# Patient Record
Sex: Female | Born: 1955
Health system: Southern US, Community
[De-identification: ages and names within clinical notes are randomized; demographics above are authoritative.]

## PROBLEM LIST (undated history)

## (undated) DIAGNOSIS — I878 Other specified disorders of veins: Secondary | ICD-10-CM

## (undated) DIAGNOSIS — G43909 Migraine, unspecified, not intractable, without status migrainosus: Secondary | ICD-10-CM

## (undated) DIAGNOSIS — H409 Unspecified glaucoma: Secondary | ICD-10-CM

## (undated) DIAGNOSIS — R928 Other abnormal and inconclusive findings on diagnostic imaging of breast: Secondary | ICD-10-CM

## (undated) DIAGNOSIS — E079 Disorder of thyroid, unspecified: Secondary | ICD-10-CM

## (undated) DIAGNOSIS — R32 Unspecified urinary incontinence: Secondary | ICD-10-CM

## (undated) DIAGNOSIS — Z8669 Personal history of other diseases of the nervous system and sense organs: Secondary | ICD-10-CM

## (undated) DIAGNOSIS — I87303 Chronic venous hypertension (idiopathic) without complications of bilateral lower extremity: Secondary | ICD-10-CM

## (undated) DIAGNOSIS — Z9889 Other specified postprocedural states: Secondary | ICD-10-CM

## (undated) DIAGNOSIS — I341 Nonrheumatic mitral (valve) prolapse: Secondary | ICD-10-CM

## (undated) DIAGNOSIS — J310 Chronic rhinitis: Secondary | ICD-10-CM

## (undated) DIAGNOSIS — Z8639 Personal history of other endocrine, nutritional and metabolic disease: Secondary | ICD-10-CM

## (undated) DIAGNOSIS — R112 Nausea with vomiting, unspecified: Secondary | ICD-10-CM

## (undated) DIAGNOSIS — Z8249 Family history of ischemic heart disease and other diseases of the circulatory system: Secondary | ICD-10-CM

## (undated) DIAGNOSIS — L299 Pruritus, unspecified: Secondary | ICD-10-CM

## (undated) HISTORY — PX: INCONTINENCE SURGERY: SHX676

## (undated) HISTORY — DX: Personal history of other diseases of the nervous system and sense organs: Z86.69

## (undated) HISTORY — DX: Chronic venous hypertension (idiopathic) without complications of bilateral lower extremity: I87.303

## (undated) HISTORY — DX: Other specified disorders of veins: I87.8

## (undated) HISTORY — DX: Unspecified urinary incontinence: R32

## (undated) HISTORY — DX: Pruritus, unspecified: L29.9

## (undated) HISTORY — DX: Disorder of thyroid, unspecified: E07.9

## (undated) HISTORY — DX: Personal history of other endocrine, nutritional and metabolic disease: Z86.39

## (undated) HISTORY — DX: Chronic rhinitis: J31.0

## (undated) HISTORY — DX: Unspecified glaucoma: H40.9

## (undated) HISTORY — DX: Migraine, unspecified, not intractable, without status migrainosus: G43.909

## (undated) HISTORY — DX: Nonrheumatic mitral (valve) prolapse: I34.1

---

## 1986-06-13 HISTORY — PX: OOPHORECTOMY: SHX86

## 2002-06-13 HISTORY — PX: EYE SURGERY: SHX253

## 2002-06-13 HISTORY — PX: CHOLECYSTECTOMY: SHX55

## 2002-06-13 HISTORY — PX: ABDOMINAL HYSTERECTOMY: SHX81

## 2002-10-12 HISTORY — PX: PARTIAL COLECTOMY: SHX5273

## 2002-10-12 HISTORY — PX: COLONOSCOPY: SHX174

## 2002-10-29 LAB — HM COLONOSCOPY

## 2015-01-12 DIAGNOSIS — Z8249 Family history of ischemic heart disease and other diseases of the circulatory system: Secondary | ICD-10-CM

## 2015-01-12 HISTORY — DX: Family history of ischemic heart disease and other diseases of the circulatory system: Z82.49

## 2015-01-22 ENCOUNTER — Ambulatory Visit (INDEPENDENT_AMBULATORY_CARE_PROVIDER_SITE_OTHER): Payer: Federal, State, Local not specified - PPO | Admitting: Family Medicine

## 2015-01-22 ENCOUNTER — Encounter: Payer: Self-pay | Admitting: Family Medicine

## 2015-01-22 VITALS — BP 112/76 | HR 71 | Temp 98.7°F | Resp 17 | Ht 66.0 in | Wt 177.0 lb

## 2015-01-22 DIAGNOSIS — G43909 Migraine, unspecified, not intractable, without status migrainosus: Secondary | ICD-10-CM

## 2015-01-22 DIAGNOSIS — G43109 Migraine with aura, not intractable, without status migrainosus: Secondary | ICD-10-CM

## 2015-01-22 DIAGNOSIS — Z8249 Family history of ischemic heart disease and other diseases of the circulatory system: Secondary | ICD-10-CM | POA: Diagnosis not present

## 2015-01-22 MED ORDER — SUMATRIPTAN SUCCINATE 100 MG PO TABS: 100.0000 mg | ORAL_TABLET | Freq: Every day | ORAL | Status: DC | PRN

## 2015-01-22 NOTE — Progress Notes (Signed)
Office Note 01/25/2015  CC:  Chief Complaint  Patient presents with  . Establish Care  . Migraine    needs medication refilled    HPI:  Melinda English is a 59 y.o. White female who is here to establish care. Patient's most recent primary MD: Dr. Ulysees Barns. Old records were not reviewed prior to or during today's visit.  Needs imitrex RF'd. Says 9 pills usually lasts her 2 mo.  No consistent pattern to her HA's. Takes amitriptyline and vitamin B2 as prophylaxis for these.  FH AAA, (dad and paternal uncle and 2 paternal aunts).  She has never been screened.  Past Medical History  Diagnosis Date  . History of narrow angle glaucoma     angle narrowing  . Migraines   . Urine incontinence   . Mitral valve prolapse     with regurg per pt    Past Surgical History  Procedure Laterality Date  . Cholecystectomy  2004  . Abdominal hysterectomy  2004    endometriosis  . Eye surgery Bilateral 2004    for narrow angle glaucoma--laser   . Partial colectomy  10/2002    endometriosis was on colon as well--approx 6 inches.  . Oophorectomy Right 1988    dermoid cyst  . Colonoscopy  04/2003    Family History  Problem Relation Age of Onset  . Arthritis Mother   . Heart disease Father   . Atrial fibrillation Father   . Diabetes Father   . AAA (abdominal aortic aneurysm) Father   . Breast cancer Sister   . Colon cancer Maternal Uncle   . Colon cancer Maternal Uncle     Social History   Social History  . Marital Status: Married    Spouse Name: N/A  . Number of Children: N/A  . Years of Education: N/A   Occupational History  . Not on file.   Social History Main Topics  . Smoking status: Never Smoker   . Smokeless tobacco: Never Used  . Alcohol Use: No  . Drug Use: No  . Sexual Activity: Not on file   Other Topics Concern  . Not on file   Social History Narrative   Married, 2 daughters.   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   No T/A/Ds.    Outpatient Encounter Prescriptions as of 01/22/2015  Medication Sig  . amitriptyline (ELAVIL) 10 MG tablet Take 10 mg by mouth at bedtime as needed.  . Ascorbic Acid (VITAMIN C) 1000 MG tablet Take 1,000 mg by mouth daily.  Marland Kitchen aspirin EC 81 MG tablet Take 81 mg by mouth daily.  . ASTRAGALUS PO Take by mouth as needed.  . cetirizine (ZYRTEC) 10 MG tablet Take 10 mg by mouth daily.  . cyanocobalamin 100 MCG tablet Take 100 mcg by mouth daily.  Marland Kitchen Specialty Vitamins Products (ONE-A-DAY BONE STRENGTH PO) Take by mouth daily.  . SUMAtriptan (IMITREX) 100 MG tablet Take 1 tablet (100 mg total) by mouth daily as needed for migraine. May repeat in 2 hours if headache persists or recurs.  . [DISCONTINUED] SUMAtriptan (IMITREX) 100 MG tablet Take 100 mg by mouth daily as needed for migraine. May repeat in 2 hours if headache persists or recurs.   No facility-administered encounter medications on file as of 01/22/2015.    Not on File  ROS Review of Systems  Constitutional: Negative for fever and fatigue.  HENT: Negative for congestion and sore throat.   Eyes:  Negative for visual disturbance.  Respiratory: Negative for cough.   Cardiovascular: Negative for chest pain.  Gastrointestinal: Negative for nausea and abdominal pain.  Genitourinary: Negative for dysuria.  Musculoskeletal: Negative for back pain and joint swelling.  Skin: Negative for rash.  Neurological: Negative for weakness and headaches.  Hematological: Negative for adenopathy.    PE; Blood pressure 112/76, pulse 71, temperature 98.7 F (37.1 C), temperature source Oral, resp. rate 17, height  (1.676 m), weight 177 lb (80.287 kg), SpO2 99 %. Gen: Alert, well appearing.  Patient is oriented to person, place, time, and situation. VWU:JWJX: no injection, icteris, swelling, or exudate.  EOMI, PERRLA. Mouth: lips without lesion/swelling.  Oral mucosa pink and moist. Oropharynx without erythema, exudate, or  swelling.  CV: RRR, no m/r/g.   LUNGS: CTA bilat, nonlabored resps, good aeration in all lung fields. EXT: no clubbing, cyanosis, or edema.   Pertinent labs:  none  ASSESSMENT AND PLAN:   New pt; obtain old records.  1) Migraine HA syndrome: historically has responded well to prn imitrex, RF'd today. Takes occasional prophylactic amitriptyline and vitamin B2 when he HA's area starting to cluster together.  2) Fam hx of AAA: will obtain screening aortic ultrasound at [facility] radiology.  An After Visit Summary was printed and given to the patient.  Return for CPE (fasting) in 2-3 months.

## 2015-01-22 NOTE — Progress Notes (Signed)
Pre visit review using our clinic review tool, if applicable. No additional management support is needed unless otherwise documented below in the visit note. 

## 2015-02-04 ENCOUNTER — Ambulatory Visit (HOSPITAL_COMMUNITY)
Admission: RE | Admit: 2015-02-04 | Discharge: 2015-02-04 | Disposition: A | Discharge status: Home / Self Care | Payer: Federal, State, Local not specified - PPO | Source: Ambulatory Visit | Attending: Family Medicine | Admitting: Family Medicine

## 2015-02-04 ENCOUNTER — Encounter (HOSPITAL_COMMUNITY): Payer: Self-pay

## 2015-02-04 DIAGNOSIS — Z8249 Family history of ischemic heart disease and other diseases of the circulatory system: Secondary | ICD-10-CM | POA: Diagnosis present

## 2015-02-04 HISTORY — DX: Family history of ischemic heart disease and other diseases of the circulatory system: Z82.49

## 2015-03-16 ENCOUNTER — Encounter: Payer: Self-pay | Admitting: Family Medicine

## 2015-04-07 ENCOUNTER — Encounter: Payer: Federal, State, Local not specified - PPO | Admitting: Family Medicine

## 2015-05-25 ENCOUNTER — Encounter: Payer: Self-pay | Admitting: Family Medicine

## 2015-05-25 ENCOUNTER — Ambulatory Visit (INDEPENDENT_AMBULATORY_CARE_PROVIDER_SITE_OTHER): Payer: Federal, State, Local not specified - PPO | Admitting: Family Medicine

## 2015-05-25 VITALS — BP 133/83 | Temp 98.7°F | Ht 67.75 in | Wt 179.0 lb

## 2015-05-25 DIAGNOSIS — J069 Acute upper respiratory infection, unspecified: Secondary | ICD-10-CM | POA: Diagnosis not present

## 2015-05-25 DIAGNOSIS — B9789 Other viral agents as the cause of diseases classified elsewhere: Principal | ICD-10-CM

## 2015-05-25 NOTE — Progress Notes (Signed)
OFFICE NOTE  05/25/2015  CC: No chief complaint on file.  HPI: Patient is a 59 y.o. Caucasian female who is here for cough. Onset 3-4 days ago with head/nasal/sinus congestion, cough began today.  Ears hurting lately, esp R. No signif ST, has had a couple of migraines with this.  No fever.  No body aches.  No rash.  Appetite/PO intake are fine.  No SOB, question of wheeze this morning.  No chest tightness.   Pertinent PMH:  Past medical, surgical, social, and family history reviewed and no changes are noted since last office visit. No hx of asthma/CLD and no hx of smoking.  MEDS:  Outpatient Prescriptions Prior to Visit  Medication Sig Dispense Refill  . amitriptyline (ELAVIL) 10 MG tablet Take 10 mg by mouth at bedtime as needed.    . Ascorbic Acid (VITAMIN C) 1000 MG tablet Take 1,000 mg by mouth daily.    Marland Kitchen. aspirin EC 81 MG tablet Take 81 mg by mouth daily.    . ASTRAGALUS PO Take by mouth as needed.    . cetirizine (ZYRTEC) 10 MG tablet Take 10 mg by mouth daily.    . cyanocobalamin 100 MCG tablet Take 100 mcg by mouth daily.    Marland Kitchen. Specialty Vitamins Products (ONE-A-DAY BONE STRENGTH PO) Take by mouth daily.    . SUMAtriptan (IMITREX) 100 MG tablet Take 1 tablet (100 mg total) by mouth daily as needed for migraine. May repeat in 2 hours if headache persists or recurs. 9 tablet 6   No facility-administered medications prior to visit.    PE: Blood pressure 133/83, temperature 98.7 F (37.1 C), temperature source Oral, height 5' 7.75" (1.721 m), weight 179 lb (81.194 kg). VS: noted--normal. Gen: alert, NAD, NONTOXIC APPEARING. HEENT: eyes without injection, drainage, or swelling.  Ears: EACs clear, TMs with normal light reflex and landmarks.  Nose: Clear rhinorrhea, with some dried, crusty exudate adherent to mildly injected mucosa.  No purulent d/c.  No paranasal sinus TTP.  No facial swelling.  Throat and mouth without focal lesion.  No pharyngial swelling, erythema, or exudate.    Neck: supple, no LAD.   LUNGS: CTA bilat, nonlabored resps.   CV: RRR, no m/r/g. EXT: no c/c/e SKIN: no rash   IMPRESSION AND PLAN:  URI with cough. No sign of bacterial infection or RAD. Continue zyrtec qhs. Get otc generic robitussin DM OR Mucinex DM and use as directed on the packaging for cough and congestion. Use otc generic saline nasal spray 2-3 times per day to irrigate/moisturize your nasal passages.  An After Visit Summary was printed and given to the patient.  She declined flu vaccine again today.  FOLLOW UP: prn

## 2015-05-25 NOTE — Patient Instructions (Signed)
Get otc generic robitussin DM OR Mucinex DM and use as directed on the packaging for cough and congestion. Use otc generic saline nasal spray 2-3 times per day to irrigate/moisturize your nasal passages.  Continue zyrtec nightly.

## 2015-05-28 ENCOUNTER — Telehealth: Payer: Self-pay | Admitting: Family Medicine

## 2015-05-28 NOTE — Telephone Encounter (Signed)
Patient feels like she has bronchitis. She is requesting Rx to be [facility]. Advised patient Dr. Milinda CaveMcGowen is gone for the day & that he will see the message tomorrow.

## 2015-05-29 ENCOUNTER — Ambulatory Visit (INDEPENDENT_AMBULATORY_CARE_PROVIDER_SITE_OTHER): Payer: Federal, State, Local not specified - PPO | Admitting: Family Medicine

## 2015-05-29 ENCOUNTER — Encounter: Payer: Self-pay | Admitting: Family Medicine

## 2015-05-29 ENCOUNTER — Encounter: Payer: Self-pay | Admitting: *Deleted

## 2015-05-29 VITALS — BP 118/82 | HR 68 | Temp 97.9°F | Resp 16 | Ht 67.75 in | Wt 179.5 lb

## 2015-05-29 DIAGNOSIS — J209 Acute bronchitis, unspecified: Secondary | ICD-10-CM | POA: Diagnosis not present

## 2015-05-29 NOTE — Progress Notes (Signed)
Pre visit review using our clinic review tool, if applicable. No additional management support is needed unless otherwise documented below in the visit note. 

## 2015-05-29 NOTE — Patient Instructions (Signed)
Try 2 puffs on your husband's inhaler every 6 hours as needed for excessive dry cough or chest tightness.

## 2015-05-29 NOTE — Telephone Encounter (Signed)
Pt advised and voiced understanding.  Pt has apt today at 1:00pm to rechecked and to get work note.

## 2015-05-29 NOTE — Progress Notes (Signed)
OFFICE VISIT  05/29/2015   CC:  Chief Complaint  Patient presents with  . Cough    also needs work note     HPI:    Patient is a 59 y.o. Caucasian female who presents for respiratory complaints.   I saw her 12/12 in office for URI sx's. Sx's clearing in upper airway lately, but cough was significant all week, just started getting better today, tightness felt in chest lately.  No purulent sputum.  No SOB or wheezing.  Mucinex DM, saline, zyrtec all used.  No fevers.  Missed work all week.  Past Medical History  Diagnosis Date  . History of narrow angle glaucoma     angle narrowing  . Migraines   . Urine incontinence   . Mitral valve prolapse     with trace regurg per old records  . Family history of abdominal aortic aneurysm 01/2015    Screening aortic u/s NEG  . History of hypothyroidism     was on armour thyroid 60mg  qd at one point--then stopped taking med old records don't have any f/u thyroid labs after this    Past Surgical History  Procedure Laterality Date  . Cholecystectomy  2004  . Abdominal hysterectomy  2004    with unilateral salpingooopherectomy (endometriosis)  . Eye surgery Bilateral 2004    for narrow angle glaucoma--laser   . Partial colectomy  10/2002    endometriosis was on colon as well--approx 6 inches.  . Oophorectomy Right 1988    dermoid cyst  . Colonoscopy  10/2002    Outpatient Prescriptions Prior to Visit  Medication Sig Dispense Refill  . amitriptyline (ELAVIL) 10 MG tablet Take 10 mg by mouth at bedtime as needed.    . Ascorbic Acid (VITAMIN C) 1000 MG tablet Take 1,000 mg by mouth daily.    Marland Kitchen. aspirin EC 81 MG tablet Take 81 mg by mouth daily.    . ASTRAGALUS PO Take by mouth as needed.    . cetirizine (ZYRTEC) 10 MG tablet Take 10 mg by mouth daily.    . cyanocobalamin 100 MCG tablet Take 100 mcg by mouth daily.    Marland Kitchen. Specialty Vitamins Products (ONE-A-DAY BONE STRENGTH PO) Take by mouth daily.    . SUMAtriptan (IMITREX) 100 MG tablet  Take 1 tablet (100 mg total) by mouth daily as needed for migraine. May repeat in 2 hours if headache persists or recurs. 9 tablet 6   No facility-administered medications prior to visit.    No Known Allergies  ROS As per HPI  PE: Blood pressure 118/82, pulse 68, temperature 97.9 F (36.6 C), temperature source Oral, resp. rate 16, height 5' 7.75" (1.721 m), weight 179 lb 8 oz (81.421 kg), SpO2 99 %. VS: noted--normal. Gen: alert, NAD, NONTOXIC APPEARING. HEENT: eyes without injection, drainage, or swelling.  Ears: EACs clear, TMs with normal light reflex and landmarks.  Nose: scant amount of clear rhinorrhea, with some dried, crusty exudate adherent to mildly injected mucosa.  No purulent d/c.  No paranasal sinus TTP.  No facial swelling.  Throat and mouth without focal lesion.  No pharyngial swelling, erythema, or exudate.   Neck: supple, no LAD.   LUNGS: CTA bilat, nonlabored resps.  Some post-exhalation coughing is noted after most deep breaths.  No wheezing and no prolongation of exp phase. CV: RRR, no m/r/g. EXT: no c/c/e SKIN: no rash  LABS:  none  IMPRESSION AND PLAN:  URI resolving. Acute bronchitis: recommended she continue current meds, try  2 puffs of albuterol q6h prn excessive dry cough or chest tightness.  Work excuse written for all of this week 12/12-12/16.  May return to work 06/01/15. An After Visit Summary was printed and given to the patient.  FOLLOW UP: No Follow-up on file.

## 2015-05-29 NOTE — Telephone Encounter (Signed)
Please advise. Thanks.  

## 2015-05-29 NOTE — Telephone Encounter (Signed)
No rx cough med recommended. She needs to try robitussin DM or mucinex DM over the counter.-thx

## 2015-12-28 ENCOUNTER — Ambulatory Visit (INDEPENDENT_AMBULATORY_CARE_PROVIDER_SITE_OTHER): Payer: Federal, State, Local not specified - PPO | Admitting: Family Medicine

## 2015-12-28 ENCOUNTER — Encounter: Payer: Self-pay | Admitting: Family Medicine

## 2015-12-28 VITALS — BP 124/79 | HR 68 | Temp 98.1°F | Resp 16 | Ht 66.25 in | Wt 180.0 lb

## 2015-12-28 DIAGNOSIS — Z124 Encounter for screening for malignant neoplasm of cervix: Secondary | ICD-10-CM

## 2015-12-28 DIAGNOSIS — Z1211 Encounter for screening for malignant neoplasm of colon: Secondary | ICD-10-CM

## 2015-12-28 DIAGNOSIS — Z0000 Encounter for general adult medical examination without abnormal findings: Secondary | ICD-10-CM

## 2015-12-28 DIAGNOSIS — Z8669 Personal history of other diseases of the nervous system and sense organs: Secondary | ICD-10-CM

## 2015-12-28 DIAGNOSIS — R7989 Other specified abnormal findings of blood chemistry: Secondary | ICD-10-CM | POA: Diagnosis not present

## 2015-12-28 DIAGNOSIS — Z1239 Encounter for other screening for malignant neoplasm of breast: Secondary | ICD-10-CM | POA: Diagnosis not present

## 2015-12-28 DIAGNOSIS — Z1283 Encounter for screening for malignant neoplasm of skin: Secondary | ICD-10-CM

## 2015-12-28 LAB — COMPREHENSIVE METABOLIC PANEL
ALBUMIN: 4.6 g/dL (ref 3.5–5.2)
ALK PHOS: 67 U/L (ref 39–117)
ALT: 27 U/L (ref 0–35)
AST: 26 U/L (ref 0–37)
BUN: 14 mg/dL (ref 6–23)
CO2: 29 mEq/L (ref 19–32)
Calcium: 9.7 mg/dL (ref 8.4–10.5)
Chloride: 102 mEq/L (ref 96–112)
Creatinine, Ser: 0.82 mg/dL (ref 0.40–1.20)
GFR: 75.49 mL/min (ref >60.00)
Glucose, Bld: 78 mg/dL (ref 70–99)
POTASSIUM: 4.2 meq/L (ref 3.5–5.1)
Sodium: 139 mEq/L (ref 135–145)
TOTAL PROTEIN: 7.6 g/dL (ref 6.0–8.3)
Total Bilirubin: 1 mg/dL (ref 0.2–1.2)

## 2015-12-28 LAB — LIPID PANEL
Cholesterol: 244 mg/dL — ABNORMAL HIGH (ref 0–200)
HDL: 42.2 mg/dL (ref >39.00)
NONHDL: 201.33
TRIGLYCERIDES: 367 mg/dL — AB (ref 0.0–149.0)
Total CHOL/HDL Ratio: 6
VLDL: 73.4 mg/dL — ABNORMAL HIGH (ref 0.0–40.0)

## 2015-12-28 LAB — TSH: TSH: 3.59 u[IU]/mL (ref 0.35–4.50)

## 2015-12-28 LAB — LDL CHOLESTEROL, DIRECT: LDL DIRECT: 115 mg/dL

## 2015-12-28 NOTE — Addendum Note (Signed)
Addended by: Smitty KnudsenSUTHERLAND, HEATHER K on: 12/28/2015 09:43 AM   Modules accepted: Kipp BroodSmartSet

## 2015-12-28 NOTE — Progress Notes (Signed)
Office Note 12/28/2015  CC:  Chief Complaint  Patient presents with  . Annual Exam    Pt is fasting.     HPI:  Janyth ContesDeborah Schreur is a 60 y.o. White female who is here for annual health maintenance exam. She is not exercising today. She gets tired standing up at work and then goes home and "lays around".  Last eye exam was approx 1 and 1/2 yrs ago.  She asks for referral to ophthalmologist. Also asks for referral to derm for routine skin cancer screening exam.   Past Medical History  Diagnosis Date  . History of narrow angle glaucoma     angle narrowing  . Migraines   . Urine incontinence   . Mitral valve prolapse     with trace regurg per old records  . Family history of abdominal aortic aneurysm 01/2015    Screening aortic u/s NEG  . History of hypothyroidism     was on armour thyroid 60mg  qd at one point--then stopped taking med old records don't have any f/u thyroid labs after this    Past Surgical History  Procedure Laterality Date  . Cholecystectomy  2004  . Abdominal hysterectomy  2004    with unilateral salpingooopherectomy (endometriosis)  . Eye surgery Bilateral 2004    for narrow angle glaucoma--laser   . Partial colectomy  10/2002    endometriosis was on colon as well--approx 6 inches.  . Oophorectomy Right 1988    dermoid cyst  . Colonoscopy  10/2002    Family History  Problem Relation Age of Onset  . Arthritis Mother   . Heart disease Father   . Atrial fibrillation Father   . Diabetes Father   . AAA (abdominal aortic aneurysm) Father   . Breast cancer Sister   . Colon cancer Maternal Uncle   . Colon cancer Maternal Uncle     Social History   Social History  . Marital Status: Married    Spouse Name: N/A  . Number of Children: N/A  . Years of Education: N/A   Occupational History  . Not on file.   Social History Main Topics  . Smoking status: Never Smoker   . Smokeless tobacco: Never Used  . Alcohol Use: No  . Drug Use: No  . Sexual  Activity: Not on file   Other Topics Concern  . Not on file   Social History Narrative   Married, 2 daughters.   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   No T/A/Ds.   MEDS: not taking amitriptyline or vit B12 tabs listed below She does take calcium and vit D supplements.  Outpatient Prescriptions Prior to Visit  Medication Sig Dispense Refill  . Ascorbic Acid (VITAMIN C) 1000 MG tablet Take 1,000 mg by mouth daily.    Marland Kitchen. aspirin EC 81 MG tablet Take 81 mg by mouth daily.    . ASTRAGALUS PO Take by mouth as needed.    . cetirizine (ZYRTEC) 10 MG tablet Take 10 mg by mouth daily.    . SUMAtriptan (IMITREX) 100 MG tablet Take 1 tablet (100 mg total) by mouth daily as needed for migraine. May repeat in 2 hours if headache persists or recurs. 9 tablet 6  . amitriptyline (ELAVIL) 10 MG tablet Take 10 mg by mouth at bedtime as needed. Reported on 12/28/2015    . cyanocobalamin 100 MCG tablet Take 100 mcg by mouth daily. Reported on 12/28/2015    . Specialty  Vitamins Products (ONE-A-DAY BONE STRENGTH PO) Take by mouth daily. Reported on 12/28/2015     No facility-administered medications prior to visit.    No Known Allergies  ROS Review of Systems  Constitutional: Negative for fever, chills, appetite change and fatigue.  HENT: Negative for congestion, dental problem, ear pain and sore throat.   Eyes: Negative for discharge, redness and visual disturbance.  Respiratory: Negative for cough, chest tightness, shortness of breath and wheezing.   Cardiovascular: Negative for chest pain, palpitations and leg swelling.  Gastrointestinal: Negative for nausea, vomiting, abdominal pain, diarrhea and blood in stool.  Genitourinary: Negative for dysuria, urgency, frequency, hematuria, flank pain and difficulty urinating.  Musculoskeletal: Negative for myalgias, back pain, joint swelling, arthralgias and neck stiffness.  Skin: Negative for pallor and rash.  Neurological:  Negative for dizziness, speech difficulty, weakness and headaches.  Hematological: Negative for adenopathy. Does not bruise/bleed easily.  Psychiatric/Behavioral: Negative for confusion and sleep disturbance. The patient is not nervous/anxious.     PE; Blood pressure 124/79, pulse 68, temperature 98.1 F (36.7 C), temperature source Oral, resp. rate 16, height 5' 6.25" (1.683 m), weight 180 lb (81.647 kg), SpO2 99 %.  Pt examined with Wallace Keller, CMA, as chaperone.  Gen: Alert, well appearing.  Patient is oriented to person, place, time, and situation. AFFECT: pleasant, lucid thought and speech. ENT: Ears: EACs clear, normal epithelium.  TMs with good light reflex and landmarks bilaterally.  Eyes: no injection, icteris, swelling, or exudate.  EOMI, PERRLA. Nose: no drainage or turbinate edema/swelling.  No injection or focal lesion.  Mouth: lips without lesion/swelling.  Oral mucosa pink and moist.  Dentition intact and without obvious caries or gingival swelling.  Oropharynx without erythema, exudate, or swelling.  Neck: supple/nontender.  No LAD, mass, or TM.  Carotid pulses 2+ bilaterally, without bruits. CV: RRR, no m/r/g.   LUNGS: CTA bilat, nonlabored resps, good aeration in all lung fields. ABD: soft, NT, ND, BS normal.  No hepatospenomegaly or mass.  No bruits. EXT: no clubbing, cyanosis, or edema.  Musculoskeletal: no joint swelling, erythema, warmth, or tenderness.  ROM of all joints intact. Skin - no sores or suspicious lesions or rashes or color changes   Pertinent labs:  None today  ASSESSMENT AND PLAN:   Health maintenance exam:  Reviewed age and gender appropriate health maintenance issues (prudent diet, regular exercise, health risks of tobacco and excessive alcohol, use of seatbelts, fire alarms in home, use of sunscreen).  Also reviewed age and gender appropriate health screening as well as vaccine recommendations. Fasting HP labs drawn today. Colon ca screening:  refer to GI for repeat colonoscopy. Cerv and breast ca screening; pt requests referral to GYN so this was ordered today. She declined HIV and Hep C screening today. She declines zostavax today.  She requested referral to dermatologist for skin screening exam so I ordered this today. I also ordered referral to ophthalmologist for her hx of glaucoma.  An After Visit Summary was printed and given to the patient.  FOLLOW UP:  Return in about 1 year (around 12/27/2016) for annual CPE (fasting).  Signed:  Santiago Bumpers, MD           12/28/2015

## 2015-12-28 NOTE — Progress Notes (Signed)
Pre visit review using our clinic review tool, if applicable. No additional management support is needed unless otherwise documented below in the visit note. 

## 2015-12-29 ENCOUNTER — Encounter: Payer: Self-pay | Admitting: Family Medicine

## 2015-12-29 LAB — CBC WITH DIFFERENTIAL/PLATELET
BASOS ABS: 0 10*3/uL (ref 0.0–0.1)
BASOS PCT: 0.8 % (ref 0.0–3.0)
Eosinophils Absolute: 0.2 10*3/uL (ref 0.0–0.7)
Eosinophils Relative: 4.6 % (ref 0.0–5.0)
HEMATOCRIT: 44.9 % (ref 36.0–46.0)
Hemoglobin: 15.1 g/dL — ABNORMAL HIGH (ref 12.0–15.0)
LYMPHS ABS: 1.9 10*3/uL (ref 0.7–4.0)
LYMPHS PCT: 35.1 % (ref 12.0–46.0)
MCHC: 33.8 g/dL (ref 30.0–36.0)
MCV: 88 fl (ref 78.0–100.0)
Monocytes Absolute: 0.4 10*3/uL (ref 0.1–1.0)
Monocytes Relative: 7.3 % (ref 3.0–12.0)
NEUTROS ABS: 2.8 10*3/uL (ref 1.4–7.7)
Neutrophils Relative %: 52.2 % (ref 43.0–77.0)
PLATELETS: 227 10*3/uL (ref 150.0–400.0)
RBC: 5.1 Mil/uL (ref 3.87–5.11)
RDW: 13 % (ref 11.5–15.5)
WBC: 5.3 10*3/uL (ref 4.0–10.5)

## 2016-01-01 ENCOUNTER — Telehealth: Payer: Self-pay | Admitting: Family Medicine

## 2016-01-01 NOTE — Telephone Encounter (Signed)
Please advise. Thanks.  

## 2016-01-01 NOTE — Telephone Encounter (Signed)
Melinda English calling with [facility] to see why the need to see this patient. According to their notes she has has a hysterectomy and oophorectomy.

## 2016-01-03 NOTE — Telephone Encounter (Signed)
I think the patient requested the referral. Pls call the patient and tell her that since she has had a hysterectomy for a non-cancerous reason, she no longer needs to have pap smears or pelvic exams. As far as mammograms go, I'll order one for her if she'll pick an imaging location. No GYN referral is necessary after all.-thx

## 2016-01-04 NOTE — Telephone Encounter (Signed)
Left message for pt to call back  °

## 2016-01-07 ENCOUNTER — Encounter: Payer: Self-pay | Admitting: Family Medicine

## 2016-01-11 NOTE — Telephone Encounter (Signed)
Left message for pt to call back  °

## 2016-01-19 NOTE — Telephone Encounter (Signed)
Left detailed message on cell vm, okay per DPR.  

## 2016-02-25 DIAGNOSIS — Z8669 Personal history of other diseases of the nervous system and sense organs: Secondary | ICD-10-CM | POA: Diagnosis not present

## 2016-02-25 DIAGNOSIS — H2513 Age-related nuclear cataract, bilateral: Secondary | ICD-10-CM | POA: Diagnosis not present

## 2016-03-07 ENCOUNTER — Encounter: Payer: Self-pay | Admitting: Family Medicine

## 2016-03-21 ENCOUNTER — Other Ambulatory Visit: Payer: Self-pay

## 2016-03-21 ENCOUNTER — Other Ambulatory Visit: Payer: Self-pay | Admitting: Family Medicine

## 2016-03-21 ENCOUNTER — Telehealth: Payer: Self-pay

## 2016-03-21 MED ORDER — SUMATRIPTAN SUCCINATE 100 MG PO TABS: 100.0000 mg | ORAL_TABLET | Freq: Every day | ORAL | 6 refills | Status: DC | PRN

## 2016-03-21 MED ORDER — AMOXICILLIN 500 MG PO CAPS: ORAL_CAPSULE | ORAL | 1 refills | Status: DC

## 2016-03-21 NOTE — Telephone Encounter (Signed)
OK, both rx's done. 

## 2016-03-21 NOTE — Telephone Encounter (Signed)
Patient requesting refill on antibiotic for dental work and requesting refill on Imitrex.

## 2016-03-28 NOTE — Telephone Encounter (Signed)
Opened in error

## 2016-04-05 DIAGNOSIS — L821 Other seborrheic keratosis: Secondary | ICD-10-CM | POA: Diagnosis not present

## 2016-04-05 DIAGNOSIS — D1801 Hemangioma of skin and subcutaneous tissue: Secondary | ICD-10-CM | POA: Diagnosis not present

## 2016-04-05 DIAGNOSIS — L218 Other seborrheic dermatitis: Secondary | ICD-10-CM | POA: Diagnosis not present

## 2016-04-05 DIAGNOSIS — L570 Actinic keratosis: Secondary | ICD-10-CM | POA: Diagnosis not present

## 2016-04-05 DIAGNOSIS — L814 Other melanin hyperpigmentation: Secondary | ICD-10-CM | POA: Diagnosis not present

## 2016-04-14 DIAGNOSIS — S134XXA Sprain of ligaments of cervical spine, initial encounter: Secondary | ICD-10-CM | POA: Diagnosis not present

## 2016-04-14 DIAGNOSIS — G43001 Migraine without aura, not intractable, with status migrainosus: Secondary | ICD-10-CM | POA: Diagnosis not present

## 2016-04-14 DIAGNOSIS — M546 Pain in thoracic spine: Secondary | ICD-10-CM | POA: Diagnosis not present

## 2016-04-19 DIAGNOSIS — S134XXA Sprain of ligaments of cervical spine, initial encounter: Secondary | ICD-10-CM | POA: Diagnosis not present

## 2016-04-19 DIAGNOSIS — M546 Pain in thoracic spine: Secondary | ICD-10-CM | POA: Diagnosis not present

## 2016-04-19 DIAGNOSIS — G43001 Migraine without aura, not intractable, with status migrainosus: Secondary | ICD-10-CM | POA: Diagnosis not present

## 2016-05-04 DIAGNOSIS — L270 Generalized skin eruption due to drugs and medicaments taken internally: Secondary | ICD-10-CM | POA: Diagnosis not present

## 2016-05-04 DIAGNOSIS — R21 Rash and other nonspecific skin eruption: Secondary | ICD-10-CM | POA: Diagnosis not present

## 2016-05-12 DIAGNOSIS — L570 Actinic keratosis: Secondary | ICD-10-CM | POA: Diagnosis not present

## 2016-05-12 DIAGNOSIS — L259 Unspecified contact dermatitis, unspecified cause: Secondary | ICD-10-CM | POA: Diagnosis not present

## 2016-05-31 DIAGNOSIS — S134XXA Sprain of ligaments of cervical spine, initial encounter: Secondary | ICD-10-CM | POA: Diagnosis not present

## 2016-05-31 DIAGNOSIS — G43001 Migraine without aura, not intractable, with status migrainosus: Secondary | ICD-10-CM | POA: Diagnosis not present

## 2016-05-31 DIAGNOSIS — M546 Pain in thoracic spine: Secondary | ICD-10-CM | POA: Diagnosis not present

## 2016-07-14 ENCOUNTER — Encounter: Payer: Self-pay | Admitting: Allergy & Immunology

## 2016-07-14 ENCOUNTER — Ambulatory Visit: Payer: Self-pay | Admitting: Allergy & Immunology

## 2016-07-14 ENCOUNTER — Ambulatory Visit (INDEPENDENT_AMBULATORY_CARE_PROVIDER_SITE_OTHER): Payer: Federal, State, Local not specified - PPO | Admitting: Allergy & Immunology

## 2016-07-14 ENCOUNTER — Encounter (INDEPENDENT_AMBULATORY_CARE_PROVIDER_SITE_OTHER): Payer: Self-pay

## 2016-07-14 VITALS — BP 124/76 | HR 92 | Temp 98.3°F | Ht 66.0 in | Wt 184.6 lb

## 2016-07-14 DIAGNOSIS — J310 Chronic rhinitis: Secondary | ICD-10-CM

## 2016-07-14 DIAGNOSIS — J3089 Other allergic rhinitis: Secondary | ICD-10-CM

## 2016-07-14 DIAGNOSIS — L539 Erythematous condition, unspecified: Secondary | ICD-10-CM | POA: Diagnosis not present

## 2016-07-14 DIAGNOSIS — L299 Pruritus, unspecified: Secondary | ICD-10-CM | POA: Diagnosis not present

## 2016-07-14 HISTORY — DX: Pruritus, unspecified: L29.9

## 2016-07-14 MED ORDER — TRIAMCINOLONE ACETONIDE 0.1 % EX OINT: 1.0000 "application " | TOPICAL_OINTMENT | Freq: Two times a day (BID) | CUTANEOUS | 2 refills | Status: DC | PRN

## 2016-07-14 MED ORDER — FEXOFENADINE HCL 180 MG PO TABS: 360.0000 mg | ORAL_TABLET | Freq: Every day | ORAL | 5 refills | Status: DC

## 2016-07-14 MED ORDER — LEVOCETIRIZINE DIHYDROCHLORIDE 5 MG PO TABS: 10.0000 mg | ORAL_TABLET | Freq: Every evening | ORAL | 5 refills | Status: DC

## 2016-07-14 NOTE — Progress Notes (Signed)
NEW PATIENT  Date of Service/Encounter:  07/14/16  Referring provider: Jeoffrey MassedMCGOWEN,PHILIP H, MD   Assessment:   Itching with confluent erythema  Chronic allergic rhinitis   Plan/Recommendations:   1. Itch with erythema - unknown trigger - Although Ms. Derrill KayGoodman did have some positives on testing, I am not convinced that these positives have anything at all to do with her clinical presentation.  - I recommended starting suppressive dosing of antihistamines: Allegra (fexofenadine) two tablets in the morning + Xyzal (levocetirizine) two tablets at night - Triamcinolone 0.1% ointment since it to use twice daily as needed for the worst areas. - We may consider a larger workup for if the rash continues in the future.   - Start the prednisone pack provided in clinic today.  2. Chronic rhinitis - Testing today showed: positives to weeds,trees, one mold, and dust mites. - Start fluticasone two sprays per nostril daily. - The antihistamines should provide some coverage as well.  3. Return in about 4 weeks (around 08/11/2016).   Subjective:   Janyth ContesDeborah Meyering is a 61 y.o. female presenting today for evaluation of  Chief Complaint  Patient presents with  . New Evaluation    C/O itching.     Janyth ContesDeborah Menning has a history of the following: There are no active problems to display for this patient.   History obtained from: chart review and patient.  Janyth Conteseborah Diop was referred by Jeoffrey MassedMCGOWEN,PHILIP H, MD.     Gavin PoundDeborah is a 61 y.o. female presenting for evaluation of pruritis. She was started on 5-FU for a pre-cancerous lesion on her right cheek during November and she was on it for 21 days. The itching started after two weeks of the medication. The itching was all over but she was only using it one a small spot on her face. She has tried using Cerve but then changed to Aveeno for dry itchy skin. She did use Benadryl a couple of times but this caused worsening itching. She never really had a  rash but they were papular according to the patient's description. This rash lasted for a couple of weeks. She was using the same laundry detergent but she changed to a Tide free after the emergence of the rash. She did have some confluent redness that worsens with particular triggers. She thinks that the stretchy pants and socks make it worse.   Gavin PoundDeborah does eat peanut butter occasionally. She got rid of tree nuts without improvement. She does eat wheat as well as seafood. She does eat dairy without a problem.   Ms. Derrill KayGoodman does take cetirizine nightly. She has chronic nasal symptoms throughout the year that has worsened over the last 2-3 years. She has post nasal drip. She does not use a nose spray. She has never been allergy tested in the past.    There is no history of asthma. She does have mitral valve prolapse with regurgitation and takes amoxicillin when she has cleanings. Otherwise, there is no history of other atopic diseases, including asthma, drug allergies, stinging insect allergies, or urticaria. There is no significant infectious history. Vaccinations are up to date.    Past Medical History: There are no active problems to display for this patient.   Medication List:  Allergies as of 07/14/2016   No Known Allergies     Medication List       Accurate as of 07/14/16  8:55 PM. Always use your most recent med list.          amoxicillin  500 MG capsule Commonly known as:  AMOXIL 4 caps po one hour prior to dental procedure   ASTRAGALUS PO Take by mouth as needed.   CALCIUM-MAGNESIUM-ZINC PO Take 1 tablet by mouth daily.   cetirizine 10 MG tablet Commonly known as:  ZYRTEC Take 10 mg by mouth daily.   fexofenadine 180 MG tablet Commonly known as:  ALLEGRA ALLERGY Take 2 tablets (360 mg total) by mouth daily.   fluticasone 50 MCG/ACT nasal spray Commonly known as:  FLONASE Place 1 spray into both nostrils daily as needed for allergies or rhinitis.   levocetirizine 5  MG tablet Commonly known as:  XYZAL Take 2 tablets (10 mg total) by mouth every evening.   SUMAtriptan 100 MG tablet Commonly known as:  IMITREX Take 1 tablet (100 mg total) by mouth daily as needed for migraine. May repeat in 2 hours if headache persists or recurs.   triamcinolone ointment 0.1 % Commonly known as:  KENALOG Apply 1 application topically 2 (two) times daily as needed.   vitamin C 1000 MG tablet Take 1,000 mg by mouth daily.   VITAMIN E PO Take 1 tablet by mouth daily as needed.       Birth History: non-contributory.   Developmental History: non-contributory.   Past Surgical History: Past Surgical History:  Procedure Laterality Date  . ABDOMINAL HYSTERECTOMY  2004   with unilateral salpingooopherectomy (endometriosis)  . CHOLECYSTECTOMY  2004  . COLONOSCOPY  10/2002   Normal  . EYE SURGERY Bilateral 2004   for narrow angle glaucoma--laser   . OOPHORECTOMY Right 1988   dermoid cyst  . PARTIAL COLECTOMY  10/2002   Segmental resection of sigmoid.  Endometriosis was on colon as well--approx 6 inches.     Family History: Family History  Problem Relation Age of Onset  . Arthritis Mother   . Heart disease Father   . Atrial fibrillation Father   . Diabetes Father   . AAA (abdominal aortic aneurysm) Father   . Breast cancer Sister   . Colon cancer Maternal Uncle   . Colon cancer Maternal Uncle   . Allergic rhinitis Neg Hx   . Asthma Neg Hx   . Atopy Neg Hx   . Angioedema Neg Hx   . Eczema Neg Hx   . Immunodeficiency Neg Hx   . Urticaria Neg Hx      Social History: Kaydra lives at home with her husband. Although she is not a smoker, her husband has smoked for 20+ years. They have two girls as well as grandkids. She and her husband are currently retired.    Review of Systems: a 14-point review of systems is pertinent for w hat is mentioned in HPI.  Otherwise, all other systems were negative. Constitutional: negative other than that listed in the  HPI Eyes: negative other than that listed in the HPI Ears, nose, mouth, throat, and face: negative other than that listed in the HPI Respiratory: negative other than that listed in the HPI Cardiovascular: negative other than that listed in the HPI Gastrointestinal: negative other than that listed in the HPI Genitourinary: negative other than that listed in the HPI Integument: negative other than that listed in the HPI Hematologic: negative other than that listed in the HPI Musculoskeletal: negative other than that listed in the HPI Neurological: negative other than that listed in the HPI Allergy/Immunologic: negative other than that listed in the HPI    Objective:   Blood pressure 124/76, pulse 92, temperature 98.3 F (36.8  C), temperature source Oral, height 5\' 6"  (1.676 m), weight 184 lb 9.6 oz (83.7 kg), SpO2 96 %. Body mass index is 29.8 kg/m.   Physical Exam:  General: Alert, interactive, in no acute distress. Cooperative with the exam. Friendly and interactive.  Eyes: No conjunctival injection present on the right, No conjunctival injection present on the left, PERRL bilaterally, No discharge on the right, No discharge on the left and No Horner-Trantas dots present Ears: Right TM pearly gray with normal light reflex, Left TM pearly gray with normal light reflex, Right TM intact without perforation and Left TM intact without perforation.  Nose/Throat: External nose within normal limits, nasal crease present and septum midline, turbinates markedly edematous and pale with clear discharge, post-pharynx erythematous with cobblestoning in the posterior oropharynx. Tonsils 2+ without exudates Neck: Supple without thyromegaly. Adenopathy: no enlarged lymph nodes appreciated in the anterior cervical, occipital, axillary, epitrochlear, inguinal, or popliteal regions Lungs: Clear to auscultation without wheezing, rhonchi or rales. No increased work of breathing. CV: Normal S1/S2, no  murmurs. Capillary refill <2 seconds.  Abdomen: Nondistended, nontender. No guarding or rebound tenderness. Bowel sounds present in all fields and hyperactive  Skin: Warm and dry, without lesions or rashes. Extremities:  No clubbing, cyanosis or edema. Neuro:   Grossly intact. No focal deficits appreciated. Responsive to questions.  Diagnostic studies:   Allergy Studies:   Indoor/Outdoor Percutaneous Adult Environmental Panel: positive to short ragweed, lamb's quarters, common mugwort, ash, Box elder, maple, Tricophyton, Df mite and Dp mites. Otherwise negative with adequate controls.  Indoor/Outdoor Selected Intradermal Environmental Panel: Negative to all with adequate controls.  Most Common Foods Panel (peanut, tree nut, soy, fish mix, shellfish mix, wheat, milk, egg): negative to all with adequate controls     Malachi Bonds, MD [facility]

## 2016-07-14 NOTE — Patient Instructions (Signed)
1. Erythema with rash - unknown trigger - Start suppressive dosing of antihistamines: Allegra (fexofenadine) two tablets in the morning + Xyzal (levocetirizine) two tablets at night - Triamcinolone 0.1% ointment since it to use twice daily as needed for the worst areas. - We may consider a larger workup for if the rash continues in the future.  - Start the prednisone pack provided in clinic today.  2. Chronic rhinitis - Testing today showed: positives to weeds,trees, one mold, and dust mites. - Start fluticasone two sprays per nostril daily. - The antihistamines should provide some coverage as well.  3. Return in about 4 weeks (around 08/11/2016).  Please inform us of any Emergency Department visits, hospitalizations, or changes in symptoms. Call us before going to the ED for breathing or allergy symptoms since we might be able to fit you in for a sick visit. Feel free to contact us anytime with any questions, problems, or concerns.  It was a pleasure to meet you today! Best wishes in the [place] Year!   Websites that have reliable patient information: 1. American Academy of Asthma, Allergy, and Immunology: www.aaaai.org 2. Food Allergy Research and Education (FARE): foodallergy.org 3. Mothers of Asthmatics: http://www.asthmacommunitynetwork.org 4. American College of Allergy, Asthma, and Immunology: www.acaai.org  Control of House Dust Mite Allergen    House dust mites play a major role in allergic asthma and rhinitis.  They occur in environments with high humidity wherever human skin, the food for dust mites is found. High levels have been detected in dust obtained from mattresses, pillows, carpets, upholstered furniture, bed covers, clothes and soft toys.  The principal allergen of the house dust mite is found in its feces.  A gram of dust may contain 1,000 mites and 250,000 fecal particles.  Mite antigen is easily measured in the air during house cleaning activities.    1. Encase  mattresses, including the box spring, and pillow, in an air tight cover.  Seal the zipper end of the encased mattresses with wide adhesive tape. 2. Wash the bedding in water of 130 degrees Farenheit weekly.  Avoid cotton comforters/quilts and flannel bedding: the most ideal bed covering is the dacron comforter. 3. Remove all upholstered furniture from the bedroom. 4. Remove carpets, carpet padding, rugs, and non-washable window drapes from the bedroom.  Wash drapes weekly or use plastic window coverings. 5. Remove all non-washable stuffed toys from the bedroom.  Wash stuffed toys weekly. 6. Have the room cleaned frequently with a vacuum cleaner and a damp dust-mop.  The patient should not be in a room which is being cleaned and should wait 1 hour after cleaning before going into the room. 7. Close and seal all heating outlets in the bedroom.  Otherwise, the room will become filled with dust-laden air.  An electric heater can be used to heat the room. 8. Reduce indoor humidity to less than 50%.  Do not use a humidifier.  Reducing Pollen Exposure  The American Academy of Allergy, Asthma and Immunology suggests the following steps to reduce your exposure to pollen during allergy seasons.    1. Do not hang sheets or clothing out to dry; pollen may collect on these items. 2. Do not mow lawns or spend time around freshly cut grass; mowing stirs up pollen. 3. Keep windows closed at night.  Keep car windows closed while driving. 4. Minimize morning activities outdoors, a time when pollen counts are usually at their highest. 5. Stay indoors as much as possible when pollen counts  or humidity is high and on windy days when pollen tends to remain in the air longer. 6. Use air conditioning when possible.  Many air conditioners have filters that trap the pollen spores. 7. Use a HEPA room air filter to remove pollen form the indoor air you breathe.

## 2016-07-17 ENCOUNTER — Encounter: Payer: Self-pay | Admitting: Family Medicine

## 2016-07-28 ENCOUNTER — Ambulatory Visit (INDEPENDENT_AMBULATORY_CARE_PROVIDER_SITE_OTHER): Payer: Federal, State, Local not specified - PPO | Admitting: Family Medicine

## 2016-07-28 ENCOUNTER — Encounter: Payer: Self-pay | Admitting: Family Medicine

## 2016-07-28 VITALS — BP 135/83 | HR 76 | Temp 98.2°F | Resp 20 | Wt 181.5 lb

## 2016-07-28 DIAGNOSIS — N390 Urinary tract infection, site not specified: Secondary | ICD-10-CM | POA: Diagnosis not present

## 2016-07-28 DIAGNOSIS — R309 Painful micturition, unspecified: Secondary | ICD-10-CM | POA: Diagnosis not present

## 2016-07-28 DIAGNOSIS — R319 Hematuria, unspecified: Secondary | ICD-10-CM | POA: Diagnosis not present

## 2016-07-28 DIAGNOSIS — R8290 Unspecified abnormal findings in urine: Secondary | ICD-10-CM | POA: Diagnosis not present

## 2016-07-28 LAB — POC URINALSYSI DIPSTICK (AUTOMATED)
Bilirubin, UA: NEGATIVE
GLUCOSE UA: NEGATIVE
Ketones, UA: NEGATIVE
NITRITE UA: NEGATIVE
PH UA: 5.5
PROTEIN UA: 30
Spec Grav, UA: <=1.005
UROBILINOGEN UA: 0.2

## 2016-07-28 MED ORDER — CEPHALEXIN 500 MG PO CAPS: 500.0000 mg | ORAL_CAPSULE | Freq: Four times a day (QID) | ORAL | 0 refills | Status: DC

## 2016-07-28 NOTE — Patient Instructions (Signed)
Start  Keflex today. We will call you with urine culture results.  Rest, HYDRATE.

## 2016-07-28 NOTE — Progress Notes (Signed)
Melinda English , 10/10/1955, 61 y.o., female MRN: [id number] Patient Care Team    Relationship Specialty Notifications Start End  Jeoffrey MassedPhilip H McGowen, MD PCP - General Family Medicine  01/22/15   Alfonse SpruceJoel Louis Gallagher, MD Consulting Physician Allergy and Immunology  07/17/16     CC: dysuria Subjective: Pt presents for an acute OV with complaints of burning with urination of 2 days duration.  Associated symptoms include dysuria, urinary frequency, suprapubic discomfort. She denies fever, chills, nausea, vomit, diarrhea or abd pain. She denies h/o of kidney stones.   No Known Allergies Social History  Substance Use Topics  . Smoking status: Never Smoker  . Smokeless tobacco: Never Used  . Alcohol use No   Past Medical History:  Diagnosis Date  . Borderline hyperlipidemia 12/2015   TLC recommended (trigs mildly high)  . Chronic rhinitis    Allergy testing 2018: positives to weeds,trees, one mold, and dust mites.  . Family history of abdominal aortic aneurysm 01/2015   Screening aortic u/s NEG  . History of hypothyroidism    was on armour thyroid 60mg  qd at one point--then stopped taking med old records don't have any f/u thyroid labs after this  . History of narrow angle glaucoma    angle narrowing  . Migraines   . Mitral valve prolapse    with trace regurg per old records  . Pruritus 07/2016   with erythema: rx'd high dose antihistamines bid by allergist  . Urine incontinence    Past Surgical History:  Procedure Laterality Date  . ABDOMINAL HYSTERECTOMY  2004   with unilateral salpingooopherectomy (endometriosis)  . CHOLECYSTECTOMY  2004  . COLONOSCOPY  10/2002   Normal  . EYE SURGERY Bilateral 2004   for narrow angle glaucoma--laser   . OOPHORECTOMY Right 1988   dermoid cyst  . PARTIAL COLECTOMY  10/2002   Segmental resection of sigmoid.  Endometriosis was on colon as well--approx 6 inches.   Family History  Problem Relation Age of Onset  . Arthritis Mother   . Heart  disease Father   . Atrial fibrillation Father   . Diabetes Father   . AAA (abdominal aortic aneurysm) Father   . Breast cancer Sister   . Colon cancer Maternal Uncle   . Colon cancer Maternal Uncle   . Allergic rhinitis Neg Hx   . Asthma Neg Hx   . Atopy Neg Hx   . Angioedema Neg Hx   . Eczema Neg Hx   . Immunodeficiency Neg Hx   . Urticaria Neg Hx    Allergies as of 07/28/2016   No Known Allergies     Medication List       Accurate as of 07/28/16  9:38 AM. Always use your most recent med list.          amoxicillin 500 MG capsule Commonly known as:  AMOXIL 4 caps po one hour prior to dental procedure   ASTRAGALUS PO Take by mouth as needed.   CALCIUM-MAGNESIUM-ZINC PO Take 1 tablet by mouth daily.   cetirizine 10 MG tablet Commonly known as:  ZYRTEC Take 10 mg by mouth daily.   fexofenadine 180 MG tablet Commonly known as:  ALLEGRA ALLERGY Take 2 tablets (360 mg total) by mouth daily.   fluticasone 50 MCG/ACT nasal spray Commonly known as:  FLONASE Place 1 spray into both nostrils daily as needed for allergies or rhinitis.   levocetirizine 5 MG tablet Commonly known as:  XYZAL Take 2 tablets (10 mg total)  by mouth every evening.   SUMAtriptan 100 MG tablet Commonly known as:  IMITREX Take 1 tablet (100 mg total) by mouth daily as needed for migraine. May repeat in 2 hours if headache persists or recurs.   triamcinolone ointment 0.1 % Commonly known as:  KENALOG Apply 1 application topically 2 (two) times daily as needed.   vitamin C 1000 MG tablet Take 1,000 mg by mouth daily.   VITAMIN E PO Take 1 tablet by mouth daily as needed.       Results for orders placed or performed in visit on 07/28/16 (from the past 24 hour(s))  POCT Urinalysis Dipstick (Automated)     Status: Abnormal   Collection Time: 07/28/16  9:28 AM  Result Value Ref Range   Color, UA yellow    Clarity, UA slightly cloudy    Glucose, UA negative    Bilirubin, UA negative     Ketones, UA negative    Spec Grav, UA <=1.005    Blood, UA large    pH, UA 5.5    Protein, UA 30    Urobilinogen, UA 0.2    Nitrite, UA Negative    Leukocytes, UA large (3+) (A) Negative   No results found.   ROS: Negative, with the exception of above mentioned in HPI   Objective:  BP 135/83 (BP Location: Left Arm, Patient Position: Sitting, Cuff Size: Normal)   Pulse 76   Temp 98.2 F (36.8 C)   Resp 20   Wt 181 lb 8 oz (82.3 kg)   SpO2 99%   BMI 29.29 kg/m  Body mass index is 29.29 kg/m. Gen: Afebrile. No acute distress. Nontoxic in appearance, well developed, well nourished.  HENT: AT. [place]. MMM Eyes:Pupils Equal Round Reactive to light, Extraocular movements intact,  Conjunctiva without redness, discharge or icterus. CV: RRR  Abd: Soft. NTND. BS present. no Masses palpated. No rebound or guarding.  MSK: no CVA tenderness Neuro: Normal gait. PERLA. EOMi. Alert. Oriented x3 .  Results for orders placed or performed in visit on 07/28/16 (from the past 24 hour(s))  POCT Urinalysis Dipstick (Automated)     Status: Abnormal   Collection Time: 07/28/16  9:28 AM  Result Value Ref Range   Color, UA yellow    Clarity, UA slightly cloudy    Glucose, UA negative    Bilirubin, UA negative    Ketones, UA negative    Spec Grav, UA <=1.005    Blood, UA large    pH, UA 5.5    Protein, UA 30    Urobilinogen, UA 0.2    Nitrite, UA Negative    Leukocytes, UA large (3+) (A) Negative    Assessment/Plan: Melinda English is a 61 y.o. female present for acute OV for  Painful urination/Urinary tract infection with hematuria, site unspecified - POCT Urinalysis Dipstick (Automated) - Urine Culture; pt will be called with results once available and if needed abx regimen will be altered to sensitivity report.  - Rest, hydrate, keflex prescribed.  - F/u PRN   electronically signed by:  Felix Pacini, DO  [facility] [place]

## 2016-08-02 ENCOUNTER — Telehealth: Payer: Self-pay | Admitting: Family Medicine

## 2016-08-02 LAB — URINE CULTURE

## 2016-08-02 NOTE — Telephone Encounter (Signed)
Please call pt: - her urine culture is positive for E.Coli infection that is sensitive the abx prescribed. F/U PRN if symptoms do not resolve only.

## 2016-08-02 NOTE — Telephone Encounter (Signed)
Spoke with patient reviewed lab results and instructions patient verbalized understanding. 

## 2016-08-16 ENCOUNTER — Telehealth: Payer: Self-pay | Admitting: Family Medicine

## 2016-08-16 ENCOUNTER — Ambulatory Visit: Payer: Federal, State, Local not specified - PPO | Admitting: Allergy & Immunology

## 2016-08-16 NOTE — Telephone Encounter (Signed)
Apt made for 08/17/16 at 1:45pm. Pt advised and voiced understanding.

## 2016-08-16 NOTE — Telephone Encounter (Signed)
Patient called to be scheduled for a bruise on her leg. The bruise is itchy and patient believes that she was bitten by something. Patient is Dr. Samul DadaMcGowen's patient however, Dr. Milinda CaveMcGowen is not available due to vacation the rest of the week. Patient stated she was willing to see Dr. Claiborne BillingsKuneff this week if possible. Please call patient to advise on if Dr. Claiborne BillingsKuneff could see patient and schedule if possible.

## 2016-08-17 ENCOUNTER — Ambulatory Visit (HOSPITAL_COMMUNITY)
Admission: RE | Admit: 2016-08-17 | Discharge: 2016-08-17 | Disposition: A | Discharge status: Home / Self Care | Payer: Federal, State, Local not specified - PPO | Source: Ambulatory Visit | Attending: Family Medicine | Admitting: Family Medicine

## 2016-08-17 ENCOUNTER — Telehealth: Payer: Self-pay | Admitting: *Deleted

## 2016-08-17 ENCOUNTER — Encounter: Payer: Self-pay | Admitting: Family Medicine

## 2016-08-17 ENCOUNTER — Telehealth: Payer: Self-pay | Admitting: Family Medicine

## 2016-08-17 ENCOUNTER — Ambulatory Visit (INDEPENDENT_AMBULATORY_CARE_PROVIDER_SITE_OTHER): Payer: Federal, State, Local not specified - PPO | Admitting: Family Medicine

## 2016-08-17 VITALS — BP 119/81 | HR 79 | Temp 98.1°F | Resp 20 | Wt 181.2 lb

## 2016-08-17 DIAGNOSIS — M79604 Pain in right leg: Secondary | ICD-10-CM

## 2016-08-17 MED ORDER — FLUOCINONIDE 0.05 % EX CREA: 1.0000 "application " | TOPICAL_CREAM | Freq: Two times a day (BID) | CUTANEOUS | 0 refills | Status: DC

## 2016-08-17 MED ORDER — DOXYCYCLINE HYCLATE 100 MG PO TABS: 100.0000 mg | ORAL_TABLET | Freq: Two times a day (BID) | ORAL | 0 refills | Status: DC

## 2016-08-17 NOTE — Progress Notes (Signed)
Melinda English , 08/02/1955, 61 y.o., female MRN: [id number] Patient Care Team    Relationship Specialty Notifications Start End  Jeoffrey MassedPhilip H McGowen, MD PCP - General Family Medicine  01/22/15   Alfonse SpruceJoel Louis Gallagher, MD Consulting Physician Allergy and Immunology  07/17/16     CC: pain right shin Subjective: Pt presents for an right  OV with complaints of right shin pain of 4 months duration. She states the area started on the anterior shin with a pruple discoloration. She then broke out in a itchy redness (not rash) over her body, which she thought was associated with the purple spot. Her allergist treated her with a prednisone burst and then an extended 21 day prednisone because there was not improvement. The itchiness resolved with steroids. The purple mark on her shin, did not resolve. She had also been prescribed kenalog cream, but she did not feel it was helpful. She reports h/o varicosities in the right leg worse then the left, but they are not typically painful. She denies fever, chills, shortness of breath, h/o blood clot. She does [place] a [place] to [place], but she thinks it was right after she noticed the purple spot.     Depression screen PHQ 2/9 05/25/2015  Decreased Interest 0  Down, Depressed, Hopeless 0  PHQ - 2 Score 0    No Known Allergies Social History  Substance Use Topics  . Smoking status: Never Smoker  . Smokeless tobacco: Never Used  . Alcohol use No   Past Medical History:  Diagnosis Date  . Borderline hyperlipidemia 12/2015   TLC recommended (trigs mildly high)  . Chronic rhinitis    Allergy testing 2018: positives to weeds,trees, one mold, and dust mites.  . Family history of abdominal aortic aneurysm 01/2015   Screening aortic u/s NEG  . History of hypothyroidism    was on armour thyroid 60mg  qd at one point--then stopped taking med old records don't have any f/u thyroid labs after this  . History of narrow angle glaucoma    angle narrowing  .  Migraines   . Mitral valve prolapse    with trace regurg per old records  . Pruritus 07/2016   with erythema: rx'd high dose antihistamines bid by allergist  . Urine incontinence    Past Surgical History:  Procedure Laterality Date  . ABDOMINAL HYSTERECTOMY  2004   with unilateral salpingooopherectomy (endometriosis)  . CHOLECYSTECTOMY  2004  . COLONOSCOPY  10/2002   Normal  . EYE SURGERY Bilateral 2004   for narrow angle glaucoma--laser   . OOPHORECTOMY Right 1988   dermoid cyst  . PARTIAL COLECTOMY  10/2002   Segmental resection of sigmoid.  Endometriosis was on colon as well--approx 6 inches.   Family History  Problem Relation Age of Onset  . Arthritis Mother   . Heart disease Father   . Atrial fibrillation Father   . Diabetes Father   . AAA (abdominal aortic aneurysm) Father   . Breast cancer Sister   . Colon cancer Maternal Uncle   . Colon cancer Maternal Uncle   . Allergic rhinitis Neg Hx   . Asthma Neg Hx   . Atopy Neg Hx   . Angioedema Neg Hx   . Eczema Neg Hx   . Immunodeficiency Neg Hx   . Urticaria Neg Hx    Allergies as of 08/17/2016   No Known Allergies     Medication List       Accurate as of 08/17/16  1:44  PM. Always use your most recent med list.          amoxicillin 500 MG capsule Commonly known as:  AMOXIL 4 caps po one hour prior to dental procedure   ASTRAGALUS PO Take by mouth as needed.   CALCIUM-MAGNESIUM-ZINC PO Take 1 tablet by mouth daily.   cetirizine 10 MG tablet Commonly known as:  ZYRTEC Take 10 mg by mouth daily.   fluticasone 50 MCG/ACT nasal spray Commonly known as:  FLONASE Place 1 spray into both nostrils daily as needed for allergies or rhinitis.   SUMAtriptan 100 MG tablet Commonly known as:  IMITREX Take 1 tablet (100 mg total) by mouth daily as needed for migraine. May repeat in 2 hours if headache persists or recurs.   triamcinolone ointment 0.1 % Commonly known as:  KENALOG Apply 1 application topically 2  (two) times daily as needed.   vitamin C 1000 MG tablet Take 1,000 mg by mouth daily.   VITAMIN E PO Take 1 tablet by mouth daily as needed.       No results found for this or any previous visit (from the past 24 hour(s)). No results found.   ROS: Negative, with the exception of above mentioned in HPI   Objective:  BP 119/81 (BP Location: Right Arm, Patient Position: Sitting, Cuff Size: Large)   Pulse 79   Temp 98.1 F (36.7 C)   Resp 20   Wt 181 lb 4 oz (82.2 kg)   SpO2 97%   BMI 29.25 kg/m  Body mass index is 29.25 kg/m. Gen: Afebrile. No acute distress. Nontoxic in appearance, well developed, well nourished.  HENT: AT. [place]. MMM Eyes:Pupils Equal Round Reactive to light, Extraocular movements intact,  Conjunctiva without redness, discharge or icterus. CV: RRR  Chest: CTAB, no wheeze or crackles. .  EXT: mild erythema and soft tissue swelling right lower ext anterior shin. Cross shaped purple lesion present. Petechiae present. Moderate to severe tenderness to palpation over area and pain with calf squeeze right only. Multiple small to medium varicosities.  Assessment/Plan: Melinda English is a 61 y.o. female present for acute OV for  Right leg pain - uncertain etiology of pain. Exam concerning for thrombophlebitis in area, but pt also with calf pain and severely tender to touch. Will obtain US LE to r/o DVT. Steroid cream (higher potency) to use 2x daily for 2 weeks.  - doppler US right lower ext.  - f/u 2 weeks, sooner if worsening. If [place] normal and not responsive to therapy, next step would either be biopsy or vascular referral.   Reviewed expectations re: course of current medical issues.  Discussed self-management of symptoms.  Outlined signs and symptoms indicating need for more acute intervention.  Patient verbalized understanding and all questions were answered.  Patient received an After-Visit Summary.   electronically signed by:  Felix Pacini, DO    [facility] - [place]

## 2016-08-17 NOTE — Telephone Encounter (Signed)
Please call pt: - No signs of blood clots, her US is normal.

## 2016-08-17 NOTE — Patient Instructions (Signed)
Start doxycyline today and cream today Doppler of leg will be scheduled.   Follow up in 2 weeks. Sooner if worsening.

## 2016-08-17 NOTE — Telephone Encounter (Signed)
Patient notified and verbalized understanding. 

## 2016-08-17 NOTE — Telephone Encounter (Signed)
Call report received patient negative for DVT per US at [facility].

## 2016-09-07 ENCOUNTER — Ambulatory Visit (INDEPENDENT_AMBULATORY_CARE_PROVIDER_SITE_OTHER): Payer: Federal, State, Local not specified - PPO | Admitting: Family Medicine

## 2016-09-07 ENCOUNTER — Encounter: Payer: Self-pay | Admitting: Family Medicine

## 2016-09-07 VITALS — BP 143/82 | HR 63 | Temp 97.9°F | Resp 16 | Ht 66.0 in | Wt 179.0 lb

## 2016-09-07 DIAGNOSIS — I8393 Asymptomatic varicose veins of bilateral lower extremities: Secondary | ICD-10-CM

## 2016-09-07 DIAGNOSIS — I8001 Phlebitis and thrombophlebitis of superficial vessels of right lower extremity: Secondary | ICD-10-CM | POA: Diagnosis not present

## 2016-09-07 NOTE — Progress Notes (Signed)
Pre visit review using our clinic review tool, if applicable. No additional management support is needed unless otherwise documented below in the visit note. 

## 2016-09-07 NOTE — Progress Notes (Signed)
OFFICE VISIT  09/07/2016   CC:  Chief Complaint  Patient presents with  . Follow-up    right leg pain    HPI:    Patient is a 61 y.o. Caucasian female who presents for f/u right lower shin pain and skin abnormality that she saw Dr. Claiborne Billings for 3 weeks ago. I reviewed her note.  She obtained a R leg venous u/s to r/o DVT and this was NEGATIVE/NORMAL.  She rx'd a higher potency steroid cream for pt to apply to the area. She did have some LE varicosities and it was unclear whether her skin changes/pain was assocciated with these.  Since last visit she says the redness is gone but the area is still hurting on/off when standing for long periods, and is definitely tender to palpation.  Some swelling at end of day bilat, R>L.   Past Medical History:  Diagnosis Date  . Borderline hyperlipidemia 12/2015   TLC recommended (trigs mildly high)  . Chronic rhinitis    Allergy testing 2018: positives to weeds,trees, one mold, and dust mites.  . Family history of abdominal aortic aneurysm 01/2015   Screening aortic u/s NEG  . History of hypothyroidism    was on armour thyroid 60mg  qd at one point--then stopped taking med old records don't have any f/u thyroid labs after this  . History of narrow angle glaucoma    angle narrowing  . Migraines   . Mitral valve prolapse    with trace regurg per old records  . Pruritus 07/2016   with erythema: rx'd high dose antihistamines bid by allergist  . Urine incontinence     Past Surgical History:  Procedure Laterality Date  . ABDOMINAL HYSTERECTOMY  2004   with unilateral salpingooopherectomy (endometriosis)  . CHOLECYSTECTOMY  2004  . COLONOSCOPY  10/2002   Normal  . EYE SURGERY Bilateral 2004   for narrow angle glaucoma--laser   . OOPHORECTOMY Right 1988   dermoid cyst  . PARTIAL COLECTOMY  10/2002   Segmental resection of sigmoid.  Endometriosis was on colon as well--approx 6 inches.    Outpatient Medications Prior to Visit  Medication Sig  Dispense Refill  . Ascorbic Acid (VITAMIN C) 1000 MG tablet Take 1,000 mg by mouth daily.    . ASTRAGALUS PO Take by mouth as needed.    Marland Kitchen CALCIUM-MAGNESIUM-ZINC PO Take 1 tablet by mouth daily.    . cetirizine (ZYRTEC) 10 MG tablet Take 10 mg by mouth daily.    . fluocinonide cream (LIDEX) 0.05 % Apply 1 application topically 2 (two) times daily. 30 g 0  . fluticasone (FLONASE) 50 MCG/ACT nasal spray Place 1 spray into both nostrils daily as needed for allergies or rhinitis.    . SUMAtriptan (IMITREX) 100 MG tablet Take 1 tablet (100 mg total) by mouth daily as needed for migraine. May repeat in 2 hours if headache persists or recurs. 9 tablet 6  . VITAMIN E PO Take 1 tablet by mouth daily as needed.    Marland Kitchen amoxicillin (AMOXIL) 500 MG capsule 4 caps po one hour prior to dental procedure (Patient not taking: Reported on 09/07/2016) 20 capsule 1  . doxycycline (VIBRA-TABS) 100 MG tablet Take 1 tablet (100 mg total) by mouth 2 (two) times daily. (Patient not taking: Reported on 09/07/2016) 10 tablet 0  . triamcinolone ointment (KENALOG) 0.1 % Apply 1 application topically 2 (two) times daily as needed. (Patient not taking: Reported on 09/07/2016) 30 g 2   No facility-administered medications prior  to visit.     No Known Allergies  ROS As per HPI  PE: Blood pressure (!) 143/82, pulse 63, temperature 97.9 F (36.6 C), temperature source Oral, resp. rate 16, height 5\' 6"  (1.676 m), weight 179 lb (81.2 kg), SpO2 100 %. Gen: Alert, well appearing.  Patient is oriented to person, place, time, and situation. AFFECT: pleasant, lucid thought and speech. Lower legs: trace pitting edema bilat.  Some scattered spider veins and varicosities are noted in both LL's, R>L. Right anterior tibial region with no erythema or violaceous hue.  No nodularity.  TTP over mid ant tibial region, but also diffusely mildly TTP over lower legs anteriorly bilat.  No tenderness in calves.  LABS:  none  IMPRESSION AND  PLAN:  1) Superficial thrombophlebitis, resolving. This is in the setting of chronic LL varicose veins that are becoming more and more symptomatic. Refer to vascular and vein specialists in [place].  An After Visit Summary was printed and given to the patient.  FOLLOW UP: Return if symptoms worsen or fail to improve.  Signed:  Santiago BumpersPhil Torina Ey, MD           09/07/2016

## 2016-09-07 NOTE — Addendum Note (Signed)
Addended by: Jeoffrey MassedMCGOWEN, PHILIP H on: 09/07/2016 04:43 PM   Modules accepted: Orders

## 2016-09-11 DIAGNOSIS — I878 Other specified disorders of veins: Secondary | ICD-10-CM

## 2016-09-11 DIAGNOSIS — I87303 Chronic venous hypertension (idiopathic) without complications of bilateral lower extremity: Secondary | ICD-10-CM

## 2016-09-11 HISTORY — DX: Chronic venous hypertension (idiopathic) without complications of bilateral lower extremity: I87.303

## 2016-09-11 HISTORY — DX: Other specified disorders of veins: I87.8

## 2016-09-15 ENCOUNTER — Ambulatory Visit (INDEPENDENT_AMBULATORY_CARE_PROVIDER_SITE_OTHER): Payer: Federal, State, Local not specified - PPO | Admitting: Family Medicine

## 2016-09-15 ENCOUNTER — Encounter: Payer: Self-pay | Admitting: Family Medicine

## 2016-09-15 VITALS — BP 126/66 | HR 86 | Temp 97.6°F | Resp 16 | Wt 179.0 lb

## 2016-09-15 DIAGNOSIS — R319 Hematuria, unspecified: Secondary | ICD-10-CM

## 2016-09-15 DIAGNOSIS — R300 Dysuria: Secondary | ICD-10-CM

## 2016-09-15 DIAGNOSIS — N3001 Acute cystitis with hematuria: Secondary | ICD-10-CM

## 2016-09-15 LAB — POC URINALSYSI DIPSTICK (AUTOMATED)
BILIRUBIN UA: NEGATIVE
GLUCOSE UA: NEGATIVE
KETONES UA: NEGATIVE
Nitrite, UA: NEGATIVE
Protein, UA: NEGATIVE
SPEC GRAV UA: 1.01 (ref 1.030–1.035)
Urobilinogen, UA: 0.2 (ref ?–2.0)
pH, UA: 5.5 (ref 5.0–8.0)

## 2016-09-15 MED ORDER — CEPHALEXIN 500 MG PO CAPS: 500.0000 mg | ORAL_CAPSULE | Freq: Four times a day (QID) | ORAL | 0 refills | Status: DC

## 2016-09-15 NOTE — Progress Notes (Signed)
OFFICE VISIT  09/15/2016   CC:  Chief Complaint  Patient presents with  . Urinary Tract Infection    Urinary frequency and burning     HPI:    Patient is a 61 y.o. Caucasian female who presents for urinary complaints. (She had an e coli UTI 07/2016 that was pansensitive--except for cephazolin, which was "not reportable", but pt says the UTI resolved with keflex qid). She has dysuria, some urgency: onset this morning.  She used AZO this morning approx 9 hours ago. Slight nausea some today.  No gross hematuria, no flank or back pain. No fevers.  No abd pain.   No vag discharge.  Past Medical History:  Diagnosis Date  . Borderline hyperlipidemia 12/2015   TLC recommended (trigs mildly high)  . Chronic rhinitis    Allergy testing 2018: positives to weeds,trees, one mold, and dust mites.  . Family history of abdominal aortic aneurysm 01/2015   Screening aortic u/s NEG  . History of hypothyroidism    was on armour thyroid  qd at one point--then stopped taking med old records don't have any f/u thyroid labs after this  . History of narrow angle glaucoma    angle narrowing  . Migraines   . Mitral valve prolapse    with trace regurg per old records  . Pruritus 07/2016   with erythema: rx'd high dose antihistamines bid by allergist  . Urine incontinence     Past Surgical History:  Procedure Laterality Date  . ABDOMINAL HYSTERECTOMY  2004   with unilateral salpingooopherectomy (endometriosis)  . CHOLECYSTECTOMY  2004  . COLONOSCOPY  10/2002   Normal  . EYE SURGERY Bilateral 2004   for narrow angle glaucoma--laser   . INCONTINENCE SURGERY    . OOPHORECTOMY Right 1988   dermoid cyst  . PARTIAL COLECTOMY  10/2002   Segmental resection of sigmoid.  Endometriosis was on colon as well--approx 6 inches.    Outpatient Medications Prior to Visit  Medication Sig Dispense Refill  . Ascorbic Acid (VITAMIN C) 1000 MG tablet Take 1,000 mg by mouth daily.    . ASTRAGALUS PO Take by  mouth as needed.    Marland Kitchen CALCIUM-MAGNESIUM-ZINC PO Take 1 tablet by mouth daily.    . cetirizine (ZYRTEC) 10 MG tablet Take 10 mg by mouth daily.    . fluticasone (FLONASE) 50 MCG/ACT nasal spray Place 1 spray into both nostrils daily as needed for allergies or rhinitis.    . SUMAtriptan (IMITREX) 100 MG tablet Take 1 tablet (100 mg total) by mouth daily as needed for migraine. May repeat in 2 hours if headache persists or recurs. 9 tablet 6  . VITAMIN E PO Take 1 tablet by mouth daily as needed.    Marland Kitchen amoxicillin (AMOXIL) 500 MG capsule 4 caps po one hour prior to dental procedure (Patient not taking: Reported on 09/07/2016) 20 capsule 1  . fluocinonide cream (LIDEX) 0.05 % Apply 1 application topically 2 (two) times daily. (Patient not taking: Reported on 09/15/2016) 30 g 0   No facility-administered medications prior to visit.     No Known Allergies  ROS As per HPI  PE: Blood pressure 126/66, pulse 86, temperature 97.6 F (36.4 C), temperature source Oral, resp. rate 16, weight 179 lb (81.2 kg), SpO2 99 %. Gen: Alert, well appearing.  Patient is oriented to person, place, time, and situation. AFFECT: pleasant, lucid thought and speech. No further exam today.  LABS:   CC UA today: moderate blood, small leu, o/w  normal.    Chemistry      Component Value Date/Time   NA 139 12/28/2015 0943   K 4.2 12/28/2015 0943   CL 102 12/28/2015 0943   CO2 29 12/28/2015 0943   BUN 14 12/28/2015 0943   CREATININE 0.82 12/28/2015 0943      Component Value Date/Time   CALCIUM 9.7 12/28/2015 0943   ALKPHOS 67 12/28/2015 0943   AST 26 12/28/2015 0943   ALT 27 12/28/2015 0943   BILITOT 1.0 12/28/2015 0943      IMPRESSION AND PLAN:  Acute cystitis with microhematuria. Start keflex  qid x 3d. Send urine for c/s.  An After Visit Summary was printed and given to the patient.  FOLLOW UP: Return if symptoms worsen or fail to improve.  Signed:  Santiago Bumpers, MD            09/15/2016

## 2016-09-17 LAB — URINE CULTURE

## 2016-09-22 DIAGNOSIS — I8311 Varicose veins of right lower extremity with inflammation: Secondary | ICD-10-CM | POA: Diagnosis not present

## 2016-09-22 DIAGNOSIS — I8312 Varicose veins of left lower extremity with inflammation: Secondary | ICD-10-CM | POA: Diagnosis not present

## 2016-09-22 DIAGNOSIS — I8001 Phlebitis and thrombophlebitis of superficial vessels of right lower extremity: Secondary | ICD-10-CM | POA: Diagnosis not present

## 2016-09-27 ENCOUNTER — Encounter: Payer: Self-pay | Admitting: Family Medicine

## 2016-10-04 DIAGNOSIS — I8311 Varicose veins of right lower extremity with inflammation: Secondary | ICD-10-CM | POA: Diagnosis not present

## 2016-10-04 DIAGNOSIS — I8312 Varicose veins of left lower extremity with inflammation: Secondary | ICD-10-CM | POA: Diagnosis not present

## 2016-10-11 DIAGNOSIS — I8311 Varicose veins of right lower extremity with inflammation: Secondary | ICD-10-CM | POA: Diagnosis not present

## 2016-10-11 DIAGNOSIS — I8312 Varicose veins of left lower extremity with inflammation: Secondary | ICD-10-CM | POA: Diagnosis not present

## 2016-11-22 ENCOUNTER — Encounter: Payer: Self-pay | Admitting: Family Medicine

## 2016-11-22 ENCOUNTER — Ambulatory Visit (INDEPENDENT_AMBULATORY_CARE_PROVIDER_SITE_OTHER): Payer: Federal, State, Local not specified - PPO | Admitting: Family Medicine

## 2016-11-22 VITALS — BP 107/72 | HR 63 | Temp 98.2°F | Resp 16 | Ht 66.0 in | Wt 180.5 lb

## 2016-11-22 DIAGNOSIS — H6982 Other specified disorders of Eustachian tube, left ear: Secondary | ICD-10-CM

## 2016-11-22 MED ORDER — PREDNISONE 10 MG PO TABS: ORAL_TABLET | ORAL | 0 refills | Status: DC

## 2016-11-22 NOTE — Progress Notes (Signed)
OFFICE VISIT  11/22/2016   CC:  Chief Complaint  Patient presents with  . Ear Pain    left    HPI:    Patient is a 61 y.o. Caucasian female who presents for left ear complaint. About 1 mo hx of feeling intermittent ear fullness/crackling noise.  No signif pain or hearing impairment. Has been taking flonase for last couple weeks, also zyrtec.  Flonase not helping. No R ear sx's.  No sneezing, coughing, or wheezing.   Says last 1-2 d the area behind L ear has felt a bit like a dull ache with mild pressure.   Past Medical History:  Diagnosis Date  . Borderline hyperlipidemia 12/2015   TLC recommended (trigs mildly high)  . Chronic rhinitis    Allergy testing 2018: positives to weeds,trees, one mold, and dust mites.  . Chronic venous hypertension w/o comp of bilateral low extrm 09/2016   [place] [address]  . Family history of abdominal aortic aneurysm 01/2015   Screening aortic u/s NEG  . History of hypothyroidism    was on armour thyroid 60mg  qd at one point--then stopped taking med old records don't have any f/u thyroid labs after this  . History of narrow angle glaucoma    angle narrowing  . Migraines   . Mitral valve prolapse    with trace regurg per old records  . Pruritus 07/2016   with erythema: rx'd high dose antihistamines bid by allergist  . Urine incontinence   . Venous stasis of lower extremity 09/2016   Right    Past Surgical History:  Procedure Laterality Date  . ABDOMINAL HYSTERECTOMY  2004   with unilateral salpingooopherectomy (endometriosis)  . CHOLECYSTECTOMY  2004  . COLONOSCOPY  10/2002   Normal  . EYE SURGERY Bilateral 2004   for narrow angle glaucoma--laser   . INCONTINENCE SURGERY    . OOPHORECTOMY Right 1988   dermoid cyst  . PARTIAL COLECTOMY  10/2002   Segmental resection of sigmoid.  Endometriosis was on colon as well--approx 6 inches.    Outpatient Medications Prior to Visit  Medication Sig Dispense Refill  .  Ascorbic Acid (VITAMIN C) 1000 MG tablet Take 1,000 mg by mouth daily.    . ASTRAGALUS PO Take by mouth as needed.    Marland Kitchen. CALCIUM-MAGNESIUM-ZINC PO Take 1 tablet by mouth daily.    . cetirizine (ZYRTEC) 10 MG tablet Take 10 mg by mouth daily.    . fluticasone (FLONASE) 50 MCG/ACT nasal spray Place 1 spray into both nostrils daily as needed for allergies or rhinitis.    . SUMAtriptan (IMITREX) 100 MG tablet Take 1 tablet (100 mg total) by mouth daily as needed for migraine. May repeat in 2 hours if headache persists or recurs. 9 tablet 6  . VITAMIN E PO Take 1 tablet by mouth daily as needed.    Marland Kitchen. amoxicillin (AMOXIL) 500 MG capsule 4 caps po one hour prior to dental procedure (Patient not taking: Reported on 09/07/2016) 20 capsule 1  . cephALEXin (KEFLEX) 500 MG capsule Take 1 capsule (500 mg total) by mouth 4 (four) times daily. (Patient not taking: Reported on 11/22/2016) 12 capsule 0  . fluocinonide cream (LIDEX) 0.05 % Apply 1 application topically 2 (two) times daily. (Patient not taking: Reported on 09/15/2016) 30 g 0   No facility-administered medications prior to visit.     No Known Allergies  ROS As per HPI  PE: Blood pressure 107/72, pulse 63, temperature 98.2 F (  36.8 C), temperature source Oral, resp. rate 16, height 5\' 6"  (1.676 m), weight 180 lb 8 oz (81.9 kg), SpO2 100 %. Gen: Alert, well appearing.  Patient is oriented to person, place, time, and situation. AFFECT: pleasant, lucid thought and speech. ENT: Ears: EACs clear, normal epithelium.  TMs with good light reflex and landmarks on right, with slight retraction and no movement with valsalva.  Left TM retracted, slightly sclerotic, and with no movement with valsalva.  Eyes: no injection, icteris, swelling, or exudate.  EOMI, PERRLA. Nose: no drainage or turbinate edema/swelling.  No injection or focal lesion.  Mouth: lips without lesion/swelling.  Oral mucosa pink and moist.  Dentition intact and without obvious caries or  gingival swelling.  Oropharynx without erythema, exudate, or swelling.    LABS:    Chemistry      Component Value Date/Time   NA 139 12/28/2015 0943   K 4.2 12/28/2015 0943   CL 102 12/28/2015 0943   CO2 29 12/28/2015 0943   BUN 14 12/28/2015 0943   CREATININE 0.82 12/28/2015 0943      Component Value Date/Time   CALCIUM 9.7 12/28/2015 0943   ALKPHOS 67 12/28/2015 0943   AST 26 12/28/2015 0943   ALT 27 12/28/2015 0943   BILITOT 1.0 12/28/2015 0943     IMPRESSION AND PLAN:  Left eustachian tube dysfunction: discussed dx and general treatment measures. She'll continue flonase daily and zyrtec daily. I told her how to add saline nasal spray intermittently as well as appropriate use of generic afrin to see if she gets some relief. Additionally, we decided on a low dose prednisone taper today: 20mg  qd x 3d, then 10mg  qd x 3d, then 5mg  qd x 2d. Use valsalva periodically to try to open tubes.  If no improvement with these measures then I'll send her to ENT for further eval (r/o cholesteatoma?).  FOLLOW UP: Return if symptoms worsen or fail to improve.  Signed:  Santiago Bumpers, MD           11/22/2016

## 2016-11-22 NOTE — Patient Instructions (Signed)
Use otc generic saline nasal spray as much as you want to irrigate/moisturize your nasal passages.  Use otc generic afrin sparingly---follow instructions on packaging---as needed for worsened ear symptoms.

## 2016-12-28 ENCOUNTER — Encounter: Payer: Federal, State, Local not specified - PPO | Admitting: Family Medicine

## 2016-12-28 DIAGNOSIS — I8311 Varicose veins of right lower extremity with inflammation: Secondary | ICD-10-CM | POA: Diagnosis not present

## 2017-01-10 ENCOUNTER — Ambulatory Visit (INDEPENDENT_AMBULATORY_CARE_PROVIDER_SITE_OTHER): Payer: Federal, State, Local not specified - PPO | Admitting: Family Medicine

## 2017-01-10 ENCOUNTER — Encounter: Payer: Self-pay | Admitting: Family Medicine

## 2017-01-10 VITALS — BP 120/64 | HR 76 | Temp 98.6°F | Resp 16 | Ht 66.0 in | Wt 178.2 lb

## 2017-01-10 DIAGNOSIS — Z1211 Encounter for screening for malignant neoplasm of colon: Secondary | ICD-10-CM

## 2017-01-10 DIAGNOSIS — R1310 Dysphagia, unspecified: Secondary | ICD-10-CM | POA: Diagnosis not present

## 2017-01-10 DIAGNOSIS — E559 Vitamin D deficiency, unspecified: Secondary | ICD-10-CM

## 2017-01-10 DIAGNOSIS — Z1231 Encounter for screening mammogram for malignant neoplasm of breast: Secondary | ICD-10-CM

## 2017-01-10 DIAGNOSIS — I872 Venous insufficiency (chronic) (peripheral): Secondary | ICD-10-CM | POA: Diagnosis not present

## 2017-01-10 DIAGNOSIS — M6281 Muscle weakness (generalized): Secondary | ICD-10-CM | POA: Diagnosis not present

## 2017-01-10 DIAGNOSIS — M25559 Pain in unspecified hip: Secondary | ICD-10-CM | POA: Diagnosis not present

## 2017-01-10 DIAGNOSIS — Z1159 Encounter for screening for other viral diseases: Secondary | ICD-10-CM

## 2017-01-10 DIAGNOSIS — I8392 Asymptomatic varicose veins of left lower extremity: Secondary | ICD-10-CM | POA: Insufficient documentation

## 2017-01-10 DIAGNOSIS — Z8639 Personal history of other endocrine, nutritional and metabolic disease: Secondary | ICD-10-CM | POA: Diagnosis not present

## 2017-01-10 DIAGNOSIS — Z1239 Encounter for other screening for malignant neoplasm of breast: Secondary | ICD-10-CM

## 2017-01-10 DIAGNOSIS — K219 Gastro-esophageal reflux disease without esophagitis: Secondary | ICD-10-CM | POA: Insufficient documentation

## 2017-01-10 DIAGNOSIS — G8929 Other chronic pain: Secondary | ICD-10-CM | POA: Insufficient documentation

## 2017-01-10 DIAGNOSIS — Z9109 Other allergy status, other than to drugs and biological substances: Secondary | ICD-10-CM | POA: Diagnosis not present

## 2017-01-10 DIAGNOSIS — G43909 Migraine, unspecified, not intractable, without status migrainosus: Secondary | ICD-10-CM | POA: Diagnosis not present

## 2017-01-10 DIAGNOSIS — R1319 Other dysphagia: Secondary | ICD-10-CM

## 2017-01-10 LAB — CBC
HCT: 44.6 % (ref 35.0–45.0)
HEMOGLOBIN: 15 g/dL (ref 11.7–15.5)
MCH: 29.9 pg (ref 27.0–33.0)
MCHC: 33.6 g/dL (ref 32.0–36.0)
MCV: 88.8 fL (ref 80.0–100.0)
MPV: 9.8 fL (ref 7.5–12.5)
PLATELETS: 249 10*3/uL (ref 140–400)
RBC: 5.02 MIL/uL (ref 3.80–5.10)
RDW: 13.4 % (ref 11.0–15.0)
WBC: 5.1 10*3/uL (ref 3.8–10.8)

## 2017-01-10 LAB — TSH: TSH: 2.41 mIU/L

## 2017-01-10 LAB — HEPATITIS C ANTIBODY: HCV AB: NONREACTIVE

## 2017-01-10 NOTE — Patient Instructions (Signed)
I have ordered referral to gastroenterology Referred for mammogram Referred to PT  Need labs  Come back to see me for a PE

## 2017-01-10 NOTE — Progress Notes (Signed)
Chief Complaint  Patient presents with  . Establish Care  New patient comes in with a "list" PMH and medicines reviewed Health maintenance reviewed - is overdue colon cancer breast can screening Overdue immunizations Discussed HIV and hep C screening. Complaints: 1. Hip pain.  Lateral hip.  Bothersome when lays on side at night.  Discussed anatomy and likely trochanteric bursitis 2. muscle weakness.  Cannot stand from chair without pushing self up.  Does not exercise.  Seems to be proximal muscle weakness thighs/hip flexors.  Will refer to PT.  Discussed importance of daily exercise 3. Swelling ankles, varicose veins, skin rash of lower legs an d"shiny" appearance.  Discussed Dx of venous insufficiency and she has seen a vascular surgeon in the past.  It is uncomfortable ans causing skin changes, would proceed with radiofrequency ablation veins 4. Trouble swallowing.  Has GERD at night. Watches diet.  Takes prn antacids.  Some bolus foods such as bread or meat will get "stuck" lower chest and she must pause before it will pass.  Since is overdue for colo, will send to GI for both EGD and colo. 5. Overweight.  Discussed low fat, lo carb diet and exercise.   6. Migraines.  Infrequent.  Would not change current management with prn Imitrex.   Patient Active Problem List   Diagnosis Date Noted  . Migraine headache 01/10/2017  . Environmental allergies 01/10/2017  . Chronic GERD 01/10/2017  . Dysphagia 01/10/2017  . Hip pain, chronic, unspecified laterality 01/10/2017  . Varicose veins of left lower extremity 01/10/2017  . Venous insufficiency 01/10/2017    Outpatient Encounter Prescriptions as of 01/10/2017  Medication Sig  . Ascorbic Acid (VITAMIN C) 1000 MG tablet Take 1,000 mg by mouth daily.  . ASTRAGALUS PO Take by mouth as needed.  Marland Kitchen. CALCIUM-MAGNESIUM-ZINC PO Take 1 tablet by mouth daily.  . cetirizine (ZYRTEC) 10 MG tablet Take 10 mg by mouth daily.  . Misc Natural Products  (OSTEO BI-FLEX JOINT SHIELD PO) Take by mouth.  . SUMAtriptan (IMITREX) 100 MG tablet Take 1 tablet (100 mg total) by mouth daily as needed for migraine. May repeat in 2 hours if headache persists or recurs.  Marland Kitchen. VITAMIN E PO Take 1 tablet by mouth daily as needed.   No facility-administered encounter medications on file as of 01/10/2017.     Past Medical History:  Diagnosis Date  . Chronic rhinitis    Allergy testing 2018: positives to weeds,trees, one mold, and dust mites.  . Chronic venous hypertension w/o comp of bilateral low extrm 09/2016   [place] [address]  . Family history of abdominal aortic aneurysm 01/2015   Screening aortic u/s NEG  . Glaucoma   . History of hypothyroidism    was on armour thyroid 60mg  qd at one point--then stopped taking med old records don't have any f/u thyroid labs after this  . History of narrow angle glaucoma    angle narrowing  . Migraines   . Mitral valve prolapse    with trace regurg per old records  . Pruritus 07/2016   with erythema: rx'd high dose antihistamines bid by allergist  . Stillborn, normal   . Thyroid disease   . Urine incontinence   . Venous stasis of lower extremity 09/2016   Right    Past Surgical History:  Procedure Laterality Date  . ABDOMINAL HYSTERECTOMY  2004   with unilateral salpingooopherectomy (endometriosis)  . CHOLECYSTECTOMY  2004  . COLONOSCOPY  10/2002  Normal  . EYE SURGERY Bilateral 2004   for narrow angle glaucoma--laser   . INCONTINENCE SURGERY    . OOPHORECTOMY Right 1988   dermoid cyst  . PARTIAL COLECTOMY  10/2002   Segmental resection of sigmoid.  Endometriosis was on colon as well--approx 6 inches.    Social History   Social History  . Marital status: Married    Spouse name: Richard  . Number of children: 2  . Years of education: 12   Occupational History  . teller     [employer]   Social History Main Topics  . Smoking status: Never Smoker  . Smokeless  tobacco: Never Used  . Alcohol use No  . Drug use: No  . Sexual activity: Yes    Birth control/ protection: Post-menopausal, Surgical   Other Topics Concern  . Not on file   Social History Narrative   Married, 2 daughters.   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       Family History  Problem Relation Age of Onset  . Arthritis Mother   . Heart disease Father   . Atrial fibrillation Father   . Diabetes Father   . AAA (abdominal aortic aneurysm) Father   . Cancer Father        pancoast tumor  . Breast cancer Sister   . Cancer Sister   . Colon cancer Maternal Uncle   . Colon cancer Maternal Uncle   . Diabetes Daughter   . Allergic rhinitis Neg Hx   . Asthma Neg Hx   . Atopy Neg Hx   . Angioedema Neg Hx   . Eczema Neg Hx   . Immunodeficiency Neg Hx   . Urticaria Neg Hx     Review of Systems  Constitutional: Negative for malaise/fatigue and weight loss.       Legs  HENT: Negative for congestion and hearing loss.        Regualr dental eval.  Post nasal drip  Eyes: Negative for blurred vision and pain.       Regualr eye exam  Respiratory: Negative for cough, shortness of breath and wheezing.   Cardiovascular: Positive for leg swelling. Negative for chest pain.  Gastrointestinal: Positive for heartburn. Negative for abdominal pain, constipation and diarrhea.  Genitourinary: Negative for dysuria and frequency.       Some stress incontinence  Musculoskeletal: Positive for joint pain. Negative for falls and myalgias.       Knees-anterior  Skin: Positive for rash.       Lower legs  Neurological: Positive for weakness and headaches. Negative for dizziness, sensory change and seizures.       Rare migraine  Psychiatric/Behavioral: Negative for depression. The patient is not nervous/anxious and does not have insomnia.    BP 120/64 (BP Location: Right Arm, Patient Position: Sitting, Cuff Size: Normal)   Pulse 76   Temp 98.6 F (37 C)  (Other (Comment))   Resp 16   Ht 5\' 6"  (1.676 m)   Wt 178 lb 4 oz (80.9 kg)   SpO2 99%   BMI 28.77 kg/m   Physical Exam  Constitutional: She is oriented to person, place, and time. She appears well-developed and well-nourished. No distress.  HENT:  Head: Normocephalic and atraumatic.  Mouth/Throat: Oropharynx is clear and moist.  Teeth well repaired  Eyes: Pupils are equal, round, and reactive to light. Conjunctivae are normal.  contac lens  Neck: Normal range of motion.  Cardiovascular: Normal rate, regular rhythm and normal heart sounds.   Pulmonary/Chest: Effort normal and breath sounds normal.  Musculoskeletal: She exhibits edema.  Hyperpigmented ankles, edema, tender, pink macular rash on lower legs anterior-stasis dermatitis  Lymphadenopathy:    She has no cervical adenopathy.  Neurological: She is alert and oriented to person, place, and time.  Skin: Rash noted.  Psychiatric: She has a normal mood and affect. Her behavior is normal. Thought content normal.  ASSESSMENT/PLAN:   1. Migraine without status migrainosus, not intractable, unspecified migraine type  2. Environmental allergies  3. Chronic GERD - CBC - COMPLETE METABOLIC PANEL WITH GFR - Lipid panel - Urinalysis, Routine w reflex microscopic  4. Esophageal dysphagia - Ambulatory referral to Gastroenterology  5. Hip pain, chronic, unspecified laterality - Ambulatory referral to Physical Therapy  6. Venous insufficiency Under care vascular specialty  7. Screen for colon cancer - Ambulatory referral to Gastroenterology  8. Vitamin D deficiency - VITAMIN D 25 Hydroxy (Vit-D Deficiency, Fractures)  9. Muscle weakness of lower extremity - Ambulatory referral to Physical Therapy  10. History of hypothyroidism Previous use armour thyroid - TSH  11. Screening for breast cancer Last 2015 - MM Digital Screening; Future  12. Encounter for hepatitis C screening test for low risk patient Discussed,  patient desires - Hepatitis C antibody   Patient Instructions  I have ordered referral to gastroenterology Referred for mammogram Referred to PT  Need labs  Come back to see me for a PE   Eustace Moore, MD

## 2017-01-11 LAB — URINALYSIS, ROUTINE W REFLEX MICROSCOPIC
Bilirubin Urine: NEGATIVE
Glucose, UA: NEGATIVE
Hgb urine dipstick: NEGATIVE
KETONES UR: NEGATIVE
LEUKOCYTES UA: NEGATIVE
NITRITE: NEGATIVE
Protein, ur: NEGATIVE
SPECIFIC GRAVITY, URINE: 1.022 (ref 1.001–1.035)
pH: 5.5 (ref 5.0–8.0)

## 2017-01-11 LAB — COMPLETE METABOLIC PANEL WITH GFR
ALBUMIN: 4.5 g/dL (ref 3.6–5.1)
ALK PHOS: 67 U/L (ref 33–130)
ALT: 25 U/L (ref 6–29)
AST: 24 U/L (ref 10–35)
BILIRUBIN TOTAL: 1.4 mg/dL — AB (ref 0.2–1.2)
BUN: 18 mg/dL (ref 7–25)
CALCIUM: 9.5 mg/dL (ref 8.6–10.4)
CO2: 24 mmol/L (ref 20–31)
CREATININE: 0.83 mg/dL (ref 0.50–0.99)
Chloride: 102 mmol/L (ref 98–110)
GFR, EST AFRICAN AMERICAN: 88 mL/min (ref >=60)
GFR, EST NON AFRICAN AMERICAN: 76 mL/min (ref >=60)
Glucose, Bld: 86 mg/dL (ref 65–99)
Potassium: 4.5 mmol/L (ref 3.5–5.3)
SODIUM: 141 mmol/L (ref 135–146)
TOTAL PROTEIN: 7.2 g/dL (ref 6.1–8.1)

## 2017-01-11 LAB — LIPID PANEL
Cholesterol: 254 mg/dL — ABNORMAL HIGH (ref <200)
HDL: 42 mg/dL — ABNORMAL LOW (ref >50)
LDL Cholesterol: 156 mg/dL — ABNORMAL HIGH (ref <100)
Total CHOL/HDL Ratio: 6 Ratio — ABNORMAL HIGH (ref <5.0)
Triglycerides: 281 mg/dL — ABNORMAL HIGH (ref <150)
VLDL: 56 mg/dL — AB (ref <30)

## 2017-01-11 LAB — VITAMIN D 25 HYDROXY (VIT D DEFICIENCY, FRACTURES): VIT D 25 HYDROXY: 50 ng/mL (ref 30–100)

## 2017-01-12 ENCOUNTER — Encounter (INDEPENDENT_AMBULATORY_CARE_PROVIDER_SITE_OTHER): Payer: Self-pay | Admitting: Internal Medicine

## 2017-01-12 ENCOUNTER — Encounter: Payer: Self-pay | Admitting: Family Medicine

## 2017-01-18 ENCOUNTER — Encounter (HOSPITAL_COMMUNITY): Payer: Self-pay | Admitting: Physical Therapy

## 2017-01-18 ENCOUNTER — Ambulatory Visit (HOSPITAL_COMMUNITY): Payer: Federal, State, Local not specified - PPO | Attending: Family Medicine | Admitting: Physical Therapy

## 2017-01-18 DIAGNOSIS — M6281 Muscle weakness (generalized): Secondary | ICD-10-CM | POA: Insufficient documentation

## 2017-01-18 DIAGNOSIS — R29898 Other symptoms and signs involving the musculoskeletal system: Secondary | ICD-10-CM | POA: Diagnosis not present

## 2017-01-18 NOTE — Therapy (Signed)
[facility] [address] [place], [place], [address] Phone: [phone]   Fax:  [phone]  Physical Therapy Evaluation  Patient Details  Name: Melinda English MRN: [id number] Date of Birth: 07/15/1955 Referring Provider: Eustace MooreYvonne Sue Nelson   Encounter Date: 01/18/2017      PT End of Session - 01/18/17 1756    Visit Number 1   Number of Visits 9   Date for PT Re-Evaluation 02/15/17   Authorization Type BCBS/Federal Employee PPO    Authorization Time Period 01/18/17 to 02/18/17   Authorization - Visit Number 1   Authorization - Number of Visits 75   PT Start Time 1600   PT Stop Time 1630   PT Time Calculation (min) 30 min   Activity Tolerance Patient tolerated treatment well   Behavior During Therapy [facility] for tasks assessed/performed      Past Medical History:  Diagnosis Date  . Chronic rhinitis    Allergy testing 2018: positives to weeds,trees, one mold, and dust mites.  . Chronic venous hypertension w/o comp of bilateral low extrm 09/2016   [place] [address]  . Family history of abdominal aortic aneurysm 01/2015   Screening aortic u/s NEG  . Glaucoma   . History of hypothyroidism    was on armour thyroid 60mg  qd at one point--then stopped taking med old records don't have any f/u thyroid labs after this  . History of narrow angle glaucoma    angle narrowing  . Migraines   . Mitral valve prolapse    with trace regurg per old records  . Pruritus 07/2016   with erythema: rx'd high dose antihistamines bid by allergist  . Stillborn, normal   . Thyroid disease   . Urine incontinence   . Venous stasis of lower extremity 09/2016   Right    Past Surgical History:  Procedure Laterality Date  . ABDOMINAL HYSTERECTOMY  2004   with unilateral salpingooopherectomy (endometriosis)  . CHOLECYSTECTOMY  2004  . COLONOSCOPY  10/2002   Normal  . EYE SURGERY Bilateral 2004   for narrow angle glaucoma--laser   .  INCONTINENCE SURGERY    . OOPHORECTOMY Right 1988   dermoid cyst  . PARTIAL COLECTOMY  10/2002   Segmental resection of sigmoid.  Endometriosis was on colon as well--approx 6 inches.    There were no vitals filed for this visit.       Subjective Assessment - 01/18/17 1749    Subjective Paitent states her main concern is not really pain as she does not have this a lot; she really feels more weak as she cannot get up from low surfaces or her recliner without using her arms. Her pain is actually usually 0/10. No falls or LOB recently. Going down stairs is difficult.    Pertinent History no significant PMH/PSH    Patient Stated Goals get stronger, get mobile, get up from low surfaces    Currently in Pain? No/denies            [facility] PT Assessment - 01/18/17 0001      Assessment   Medical Diagnosis hip pain/LE weakness    Referring Provider Eustace MooreYvonne Sue Nelson    Onset Date/Surgical Date --  chronic    Next MD Visit Dr. Delton SeeNelson not scheduled    Prior Therapy none      Precautions   Precautions None     Restrictions   Weight Bearing Restrictions No     Balance Screen  Has the patient fallen in the past 6 months No   Has the patient had a decrease in activity level because of a fear of falling?  No   Is the patient reluctant to leave their home because of a fear of falling?  No     Prior Function   Level of Independence Independent;Independent with basic ADLs;Independent with gait;Independent with transfers   Vocation Full time employment   Vocation Requirements [employer]      Strength   Right Hip Flexion 3/5   Right Hip Extension 3+/5   Right Hip ABduction 4+/5   Left Hip Flexion 3/5   Left Hip Extension 3+/5   Left Hip ABduction 4/5   Right Knee Flexion 5/5   Right Knee Extension 5/5   Left Knee Flexion 5/5   Left Knee Extension 5/5   Right Ankle Dorsiflexion 5/5   Left Ankle Dorsiflexion 5/5     Transfers   Five time sit to stand comments  13.4 seconds  standard chair; unable to perform from 14 inch box      Ambulation/Gait   Gait Comments gait shows proximal weakness; difficulty with stairs limited by weakness, needs to turn lateraly with stair descent      6 minute walk test results    Aerobic Endurance Distance Walked 904   Endurance additional comments , no device      High Level Balance   High Level Balance Comments TUG 8.5 no device             Objective measurements completed on examination: See above findings.                  PT Education - 01/18/17 1755    Education provided Yes   Education Details prognosis, HEP, POC    Person(s) Educated Patient   Methods Explanation;Demonstration;Handout   Comprehension Verbalized understanding;Returned demonstration;Need further instruction          PT Short Term Goals - 01/18/17 1759      PT SHORT TERM GOAL #1   Title Patient to be compliant with correct performance of HEP, to be updated regularly    Time 1   Period Weeks   Status New   Target Date 01/25/17     PT SHORT TERM GOAL #2   Title Patient to be able to perform 5x sit to stand in 10 seconds or less from standard chair in order to show improved strength and mobility    Time 2   Period Weeks   Status New   Target Date 02/01/17           PT Long Term Goals - 01/18/17 1759      PT LONG TERM GOAL #1   Title Patient to improve all tested muscle groups by at least 1 MMT grade in order to improve strength and stair navigation    Time 4   Period Weeks   Status New   Target Date 02/15/17     PT LONG TERM GOAL #2   Title Patient to be able to come to a full stand from a 14 inch box with no UEs on 3/5 attempts in order to show improved mobilty at home    Time 4   Period Weeks   Status New     PT LONG TERM GOAL #3   Title Patient to be participatory in regular balanced exercise program, at least 20 minutes in duration and at least 3 days per week,  in order to maintain functional gains  and prevent recurrence of condition    Time 4   Period Weeks   Status New                Plan - 2017-01-30 1756    Clinical Impression Statement patient reports she does not per say have hip pain, her main concern is really weakness and not being able to get up from low surfaces. Examination reveals significant proximal weakness, core weakness, poor functional activity tolerance, and difficulty from getting up from low surfaces which she reports is an issue at home due to her dining room chairs being fairly low. Recommend skilled PT services to address functional deficits and establish independent exercise routine to prevent recurrence of condition.    History and Personal Factors relevant to plan of care: no significant PMH/PSH, sedentary lifestyle    Clinical Presentation Stable   Clinical Presentation due to: chronic deconditioning    Clinical Decision Making Low   Rehab Potential Excellent   Clinical Impairments Affecting Rehab Potential (+) motivated to participate, high PLOF; (-) chronicity of problem, sedentary lifestyle    PT Frequency 2x / week   PT Duration 4 weeks   PT Treatment/Interventions ADLs/Self Care Home Management;Gait training;Stair training;Functional mobility training;Therapeutic activities;Therapeutic exercise;Balance training;Neuromuscular re-education;Patient/family education;Manual techniques;Passive range of motion;Energy conservation   PT Next Visit Plan review initial eval/goals; proximal strengthening, CKC strengthening focus. STS form from progressive low surfaces. F/U on walking program.    PT Home Exercise Plan Eval: bridges, prone hip extension, walking routine    Consulted and Agree with Plan of Care Patient      Patient will benefit from skilled therapeutic intervention in order to improve the following deficits and impairments:  Decreased mobility, Decreased activity tolerance, Decreased strength, Difficulty walking, Improper body mechanics,  Impaired flexibility  Visit Diagnosis: Muscle weakness (generalized) - Plan: PT plan of care cert/re-cert  Other symptoms and signs involving the musculoskeletal system - Plan: PT plan of care cert/re-cert      G-Codes - 01-30-2017 1801    Functional Assessment Tool Used (Outpatient Only) Based on skilled clinical assessment of strength, mobility, functional activity tolerance    Functional Limitation Mobility: Walking and moving around   Mobility: Walking and Moving Around Current Status (Z6109) At least 20 percent but less than 40 percent impaired, limited or restricted   Mobility: Walking and Moving Around Goal Status (U0454) At least 1 percent but less than 20 percent impaired, limited or restricted       Problem List Patient Active Problem List   Diagnosis Date Noted  . Migraine headache 01/10/2017  . Environmental allergies 01/10/2017  . Chronic GERD 01/10/2017  . Dysphagia 01/10/2017  . Hip pain, chronic, unspecified laterality 01/10/2017  . Varicose veins of left lower extremity 01/10/2017  . Venous insufficiency 01/10/2017    Nedra Hai PT, DPT [phone]  [facility] [address] [place], [place], [address] Phone: [phone]   Fax:  [phone]  Name: Melinda English MRN: [id number] Date of Birth: 1955/11/06

## 2017-01-24 ENCOUNTER — Telehealth (HOSPITAL_COMMUNITY): Payer: Self-pay | Admitting: Physical Therapy

## 2017-01-24 NOTE — Telephone Encounter (Signed)
Pt states she can not come put once away due to her work schedule. NF 01/24/17

## 2017-01-25 ENCOUNTER — Ambulatory Visit (HOSPITAL_COMMUNITY): Payer: Federal, State, Local not specified - PPO

## 2017-01-25 ENCOUNTER — Telehealth (HOSPITAL_COMMUNITY): Payer: Self-pay | Admitting: Family Medicine

## 2017-01-25 DIAGNOSIS — R29898 Other symptoms and signs involving the musculoskeletal system: Secondary | ICD-10-CM

## 2017-01-25 DIAGNOSIS — M6281 Muscle weakness (generalized): Secondary | ICD-10-CM | POA: Diagnosis not present

## 2017-01-25 NOTE — Patient Instructions (Signed)
Abduction    Lift leg up toward ceiling. Return.  Repeat 15 times each leg. Do 2 sessions per day.  http://gt2.exer.us/386   Copyright  VHI. All rights reserved.  Functional Quadriceps: Sit to Stand    Sit on edge of chair, feet flat on floor. Stand upright, extending knees fully. Repeat 10 times per set. Do 1-2 sets per session. http://orth.exer.us/735   Copyright  VHI. All rights reserved.   HIP: Flexion / KNEE: Extension, Straight Leg Raise    Raise leg, keeping knee straight. Perform slowly. 15  reps per set, 1-2 sets per day.   Copyright  VHI. All rights reserved.

## 2017-01-25 NOTE — Therapy (Signed)
[facility] [address] , [place], [address] Phone: [phone]   Fax:  [phone]  Physical Therapy Treatment  Patient Details  Name: Melinda English MRN: [id number] Date of Birth: 10/25/1955 Referring Provider: Eustace MooreYvonne Sue Nelson   Encounter Date: 01/25/2017      PT End of Session - 01/25/17 1732    Visit Number 2   Number of Visits 9   Date for PT Re-Evaluation 02/15/17   Authorization Type BCBS/Federal Employee PPO    Authorization Time Period 01/18/17 to 02/18/17   Authorization - Visit Number 2   Authorization - Number of Visits 75   PT Start Time 1732   PT Stop Time 1810   PT Time Calculation (min) 38 min   Activity Tolerance Patient tolerated treatment well   Behavior During Therapy [facility] for tasks assessed/performed      Past Medical History:  Diagnosis Date  . Chronic rhinitis    Allergy testing 2018: positives to weeds,trees, one mold, and dust mites.  . Chronic venous hypertension w/o comp of bilateral low extrm 09/2016   [place] [address]  . Family history of abdominal aortic aneurysm 01/2015   Screening aortic u/s NEG  . Glaucoma   . History of hypothyroidism    was on armour thyroid 60mg  qd at one point--then stopped taking med old records don't have any f/u thyroid labs after this  . History of narrow angle glaucoma    angle narrowing  . Migraines   . Mitral valve prolapse    with trace regurg per old records  . Pruritus 07/2016   with erythema: rx'd high dose antihistamines bid by allergist  . Stillborn, normal   . Thyroid disease   . Urine incontinence   . Venous stasis of lower extremity 09/2016   Right    Past Surgical History:  Procedure Laterality Date  . ABDOMINAL HYSTERECTOMY  2004   with unilateral salpingooopherectomy (endometriosis)  . CHOLECYSTECTOMY  2004  . COLONOSCOPY  10/2002   Normal  . EYE SURGERY Bilateral 2004   for narrow angle glaucoma--laser   .  INCONTINENCE SURGERY    . OOPHORECTOMY Right 1988   dermoid cyst  . PARTIAL COLECTOMY  10/2002   Segmental resection of sigmoid.  Endometriosis was on colon as well--approx 6 inches.    There were no vitals filed for this visit.      Subjective Assessment - 01/25/17 1731    Subjective No reports of pain, has began walking program and reports compliance with HEP daily.   Pertinent History no significant PMH/PSH    Patient Stated Goals get stronger, get mobile, get up from low surfaces    Currently in Pain? No/denies              [facility] - 01/25/17 0001      Exercises   Exercises Knee/Hip     Knee/Hip Exercises: Standing   Heel Raises 15 reps   Heel Raises Limitations toe raises   Functional Squat 10 reps   SLS Rt 60" 1st attempt, LT 56" max of 3   Other Standing Knee Exercises sidestep with RTB     Knee/Hip Exercises: Supine   Bridges 15 reps   Bridges Limitations cueing to reduce lumbar arch and push with heels    Straight Leg Raises 15 reps   Straight Leg Raises Limitations floating     Knee/Hip Exercises: Sidelying   Hip ABduction Limitations 12 reps  with cueing for form     Knee/Hip Exercises: Prone   Hip Extension 15 reps                PT Education - 01/25/17 1737    Education provided Yes   Education Details Reviewed goals, assured compliance with HEP and copy of eval given to pt.  Min cueing to improve form with therex for maximal mm activation/benefits   Person(s) Educated Patient   Methods Explanation;Demonstration;Handout   Comprehension Verbalized understanding;Returned demonstration          PT Short Term Goals - 01/18/17 1759      PT SHORT TERM GOAL #1   Title Patient to be compliant with correct performance of HEP, to be updated regularly    Time 1   Period Weeks   Status New   Target Date 01/25/17     PT SHORT TERM GOAL #2   Title Patient to be able to perform 5x sit to stand in 10 seconds or less  from standard chair in order to show improved strength and mobility    Time 2   Period Weeks   Status New   Target Date 02/01/17           PT Long Term Goals - 01/18/17 1759      PT LONG TERM GOAL #1   Title Patient to improve all tested muscle groups by at least 1 MMT grade in order to improve strength and mobility    Time 4   Period Weeks   Status New   Target Date 02/15/17     PT LONG TERM GOAL #2   Title Patient to be able to come to a full stand from a 14 inch box with no UEs on 3/5 attempts in order to show improved mobilty at home    Time 4   Period Weeks   Status New     PT LONG TERM GOAL #3   Title Patient to be participatory in regular balanced exercise program, at least 20 minutes in duration and at least 3 days per week, in order to maintain functional gains and prevent recurrence of condition    Time 4   Period Weeks   Status New               Plan - 01/25/17 1742    Clinical Impression Statement Reviewed goals, assured compliance with HEP and copy of eval given to pt.  Pt able to recall HEP exercises, min cueing to improve form for proper muscle activation.  Pt reports she has began a daily walking program and can tell her activity tolerance is already improving.  Reoprts of ability to come to PT 1x/week due to work schedule.  Additional exercises added to HEP to progress hip strengthening due to reduction in frequency, able to demonstrated and verbally reports understanding with additional exercises.  Progressed to CKC with min cueing for form prior therex, able to complete all with noted fatigue, no reoprts of pain.     Rehab Potential Excellent   Clinical Impairments Affecting Rehab Potential (+) motivated to participate, high PLOF; (-) chronicity of problem, sedentary lifestyle    PT Frequency 2x / week  1x/week per pt due to work schedule   PT Duration 4 weeks   PT Treatment/Interventions ADLs/Self Care Home Management;Gait training;Stair  training;Functional mobility training;Therapeutic activities;Therapeutic exercise;Balance training;Neuromuscular re-education;Patient/family education;Manual techniques;Passive range of motion;Energy conservation   PT Next Visit Plan Progress CKC strengthening for hip strengthening.  F/U with compliance with new HEP exercises.  Next session begin wall squats, continue abduction and progress CKC.   PT Home Exercise Plan Eval: bridges, prone hip extension, walking routine, floating SLR, abduction and STS.      Patient will benefit from skilled therapeutic intervention in order to improve the following deficits and impairments:  Decreased mobility, Decreased activity tolerance, Decreased strength, Difficulty walking, Improper body mechanics, Impaired flexibility  Visit Diagnosis: Muscle weakness (generalized)  Other symptoms and signs involving the musculoskeletal system     Problem List Patient Active Problem List   Diagnosis Date Noted  . Migraine headache 01/10/2017  . Environmental allergies 01/10/2017  . Chronic GERD 01/10/2017  . Dysphagia 01/10/2017  . Hip pain, chronic, unspecified laterality 01/10/2017  . Varicose veins of left lower extremity 01/10/2017  . Venous insufficiency 01/10/2017   Becky Sax, LPTA; CBIS [phone]  Juel Burrow 01/25/2017, 6:16 PM  [facility] [address] [place], [place], [address] Phone: [phone]   Fax:  [phone]  Name: Melinda English MRN: [id number] Date of Birth: 25-Aug-1955

## 2017-01-25 NOTE — Telephone Encounter (Signed)
01/25/17  Pt returned our call and said that she can't come in earlier because she can't leave work

## 2017-01-25 NOTE — Progress Notes (Signed)
This pt appears to have established [facility] (Dr. Rica MastYvonne Nelson). Pls change PCP on snapshot from me to Dr. Delton SeeNelson. -thx

## 2017-01-31 ENCOUNTER — Encounter: Payer: Self-pay | Admitting: Family Medicine

## 2017-01-31 ENCOUNTER — Ambulatory Visit (HOSPITAL_COMMUNITY): Payer: Federal, State, Local not specified - PPO | Admitting: Physical Therapy

## 2017-01-31 ENCOUNTER — Telehealth (HOSPITAL_COMMUNITY): Payer: Self-pay | Admitting: Family Medicine

## 2017-01-31 NOTE — Telephone Encounter (Signed)
01/31/17  pt called and said she wouldn't be here today

## 2017-02-02 ENCOUNTER — Encounter (INDEPENDENT_AMBULATORY_CARE_PROVIDER_SITE_OTHER): Payer: Self-pay | Admitting: Internal Medicine

## 2017-02-02 ENCOUNTER — Other Ambulatory Visit (INDEPENDENT_AMBULATORY_CARE_PROVIDER_SITE_OTHER): Payer: Self-pay | Admitting: Internal Medicine

## 2017-02-02 ENCOUNTER — Encounter (INDEPENDENT_AMBULATORY_CARE_PROVIDER_SITE_OTHER): Payer: Self-pay | Admitting: *Deleted

## 2017-02-02 ENCOUNTER — Telehealth (INDEPENDENT_AMBULATORY_CARE_PROVIDER_SITE_OTHER): Payer: Self-pay | Admitting: *Deleted

## 2017-02-02 ENCOUNTER — Ambulatory Visit (INDEPENDENT_AMBULATORY_CARE_PROVIDER_SITE_OTHER): Payer: Federal, State, Local not specified - PPO | Admitting: Internal Medicine

## 2017-02-02 VITALS — BP 130/70 | HR 72 | Temp 97.8°F | Ht 66.5 in | Wt 178.6 lb

## 2017-02-02 DIAGNOSIS — R1310 Dysphagia, unspecified: Secondary | ICD-10-CM

## 2017-02-02 DIAGNOSIS — R1319 Other dysphagia: Secondary | ICD-10-CM

## 2017-02-02 DIAGNOSIS — Z1211 Encounter for screening for malignant neoplasm of colon: Secondary | ICD-10-CM | POA: Diagnosis not present

## 2017-02-02 MED ORDER — PEG 3350-KCL-NA BICARB-NACL 420 G PO SOLR: 4000.0000 mL | Freq: Once | ORAL | 0 refills | Status: AC

## 2017-02-02 NOTE — Patient Instructions (Signed)
The risks of bleeding, perforation and infection were reviewed with patient.  

## 2017-02-02 NOTE — Telephone Encounter (Signed)
Patient needs trilyte 

## 2017-02-02 NOTE — Progress Notes (Signed)
Subjective:    Patient ID: Melinda English, female    DOB: 01-Feb-1956, 61 y.o.   MRN: [id number]  HPI  Referred by Dr. Oswaldo Done for dysphagia. If she doesn't chew her meat up well, this well lodge. Breads also will lodge. She also has acid reflux at time. Symptoms for about  4-5 yrs.  Her appetite is good. No weight loss. BM daily. No melena or BRRB. Last colonoscopy in 2004 and was normal.( [employer].        Review of Systems Past Medical History:  Diagnosis Date  . Chronic rhinitis    Allergy testing 2018: positives to weeds,trees, one mold, and dust mites.  . Chronic venous hypertension w/o comp of bilateral low extrm 09/2016   Vein/vascular clinic in [place] [address]  . Family history of abdominal aortic aneurysm 01/2015   Screening aortic u/s NEG  . Glaucoma   . History of hypothyroidism    was on armour thyroid 60mg  qd at one point--then stopped taking med old records don't have any f/u thyroid labs after this  . History of narrow angle glaucoma    angle narrowing  . Migraines   . Mitral valve prolapse    with trace regurg per old records  . Pruritus 07/2016   with erythema: rx'd high dose antihistamines bid by allergist  . Stillborn, normal   . Thyroid disease   . Urine incontinence   . Venous stasis of lower extremity 09/2016   Right    Past Surgical History:  Procedure Laterality Date  . ABDOMINAL HYSTERECTOMY  2004   with unilateral salpingooopherectomy (endometriosis)  . CHOLECYSTECTOMY  2004  . COLONOSCOPY  10/2002   Normal  . EYE SURGERY Bilateral 2004   for narrow angle glaucoma--laser   . INCONTINENCE SURGERY    . OOPHORECTOMY Right 1988   dermoid cyst  . PARTIAL COLECTOMY  10/2002   Segmental resection of sigmoid.  Endometriosis was on colon as well--approx 6 inches.    No Known Allergies  Current Outpatient Prescriptions on File Prior to Visit  Medication Sig Dispense Refill  . Ascorbic Acid (VITAMIN C) 1000 MG tablet Take 1,000  mg by mouth daily.    . ASTRAGALUS PO Take by mouth as needed.    Marland Kitchen CALCIUM-MAGNESIUM-ZINC PO Take 1 tablet by mouth daily.    . cetirizine (ZYRTEC) 10 MG tablet Take 10 mg by mouth daily.    . Misc Natural Products (OSTEO BI-FLEX JOINT SHIELD PO) Take by mouth.    . SUMAtriptan (IMITREX) 100 MG tablet Take 1 tablet (100 mg total) by mouth daily as needed for migraine. May repeat in 2 hours if headache persists or recurs. 9 tablet 6  . VITAMIN E PO Take 1 tablet by mouth daily as needed.     No current facility-administered medications on file prior to visit.         Objective:   Physical Exam Blood pressure 130/70, pulse 72, temperature 97.8 F (36.6 C), height 5' 6.5" (1.689 m), weight 178 lb 9.6 oz (81 kg). Alert and oriented. Skin warm and dry. Oral mucosa is moist.   . Sclera anicteric, conjunctivae is pink. Thyroid not enlarged. No cervical lymphadenopathy. Lungs clear. Heart regular rate and rhythm.  Abdomen is soft. Bowel sounds are positive. No hepatomegaly. No abdominal masses felt. No tenderness.  No edema to lower extremities. Patient is alert and oriented.         Assessment & Plan:  Dysphagia. EGD/ED. Stricture needs  to be ruled out.  Colonoscopy. Needs screening.

## 2017-02-08 ENCOUNTER — Ambulatory Visit (HOSPITAL_COMMUNITY): Payer: Federal, State, Local not specified - PPO | Admitting: Physical Therapy

## 2017-02-13 ENCOUNTER — Other Ambulatory Visit: Payer: Self-pay | Admitting: Family Medicine

## 2017-02-14 ENCOUNTER — Ambulatory Visit (HOSPITAL_COMMUNITY): Payer: Federal, State, Local not specified - PPO | Attending: Family Medicine

## 2017-02-14 DIAGNOSIS — R29898 Other symptoms and signs involving the musculoskeletal system: Secondary | ICD-10-CM | POA: Insufficient documentation

## 2017-02-14 DIAGNOSIS — M6281 Muscle weakness (generalized): Secondary | ICD-10-CM

## 2017-02-14 NOTE — Therapy (Signed)
[facility] [address] 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 Chronic rhinitis    Allergy testing 2018: positives to weeds,trees, one mold, and dust mites.  . Chronic venous hypertension w/o comp of bilateral low extrm 09/2016   Vein/vascular clinic in [place] [address]  . Family history of abdominal aortic aneurysm 01/2015   Screening aortic u/s NEG  . Glaucoma   . History of hypothyroidism    was on armour thyroid 60mg  qd at one point--then stopped taking med old records don't have any f/u thyroid labs after this  . History of narrow angle glaucoma    angle narrowing  . Migraines   . Mitral valve prolapse    with trace regurg per old records  . Pruritus 07/2016   with erythema: rx'd high dose antihistamines bid by allergist  . Stillborn, normal   . Thyroid disease   . Urine incontinence   . Venous stasis of lower extremity 09/2016   Right    Past Surgical History:  Procedure Laterality Date  . ABDOMINAL HYSTERECTOMY  2004   with unilateral salpingooopherectomy (endometriosis)  . CHOLECYSTECTOMY  2004  . COLONOSCOPY  10/2002   Normal  . EYE SURGERY Bilateral 2004   for narrow angle  glaucoma--laser   . INCONTINENCE SURGERY    . OOPHORECTOMY Right 1988   dermoid cyst  . PARTIAL COLECTOMY  10/2002   Segmental resection of sigmoid.  Endometriosis was on colon as well--approx 6 inches.    There were no vitals filed for this visit.      Subjective Assessment - 02/14/17 1732    Subjective Pt reports compliance wiht HEP and stated increase ease with STS.  No reoprts of pain today.  Has been compliance iwth HEP and walking program 15-20 minutes 3-4 days a week.     Pertinent History no significant PMH/PSH    Patient Stated Goals get stronger, get mobile, get up from low surfaces    Currently in Pain? No/denies             [facility] Adult PT Treatment/Exercise - 02/14/17 0001      Knee/Hip Exercises: Standing   Heel Raises 15 reps   Heel Raises Limitations toe raises on slope   Forward Lunges Both;10 reps   Forward Lunges Limitations on floor   Lateral Step Up Both;15 reps;Hand Hold: 0;Step Height: 6"   Forward Step Up Both;15 reps;Step Height: 6"   Step Down Both;10 reps;Hand Hold: 1;Step Height: 4"   Functional Squat 10 reps   Functional Squat Limitations front of chair for form   Wall Squat 10 reps;3 seconds  SLS with Vectors 5x5" BLE on foam    Other Standing Knee Exercises sidestep with RTB 2RT     Knee/Hip Exercises: Seated   Sit to Sand 10 reps;without UE support  eccentric control     Knee/Hip Exercises: Sidelying   Hip ABduction 15 reps;Both   Hip ABduction Limitations front of mirror for form                  PT Short Term Goals - 01/18/17 1759      PT SHORT TERM GOAL #1   Title Patient to be compliant with correct performance of HEP, to be updated regularly    Time 1   Period Weeks   Status New   Target Date 01/25/17     PT SHORT TERM GOAL #2   Title Patient to be able to perform 5x sit to stand in 10 seconds or less from standard chair in order to show improved strength and mobility    Time 2   Period Weeks   Status New    Target Date 02/01/17           PT Long Term Goals - 01/18/17 1759      PT LONG TERM GOAL #1   Title Patient to improve all tested muscle groups by at least 1 MMT grade in order to improve strength and mobility    Time 4   Period Weeks   Status New   Target Date 02/15/17     PT LONG TERM GOAL #2   Title Patient to be able to come to a full stand from a 14 inch box with no UEs on 3/5 attempts in order to show improved mobilty at home    Time 4   Period Weeks   Status New     PT LONG TERM GOAL #3   Title Patient to be participatory in regular balanced exercise program, at least 20 minutes in duration and at least 3 days per week, in order to maintain functional gains and prevent recurrence of condition    Time 4   Period Weeks   Status New               Plan - 02/14/17 1824    Clinical Impression Statement Pt making great progress towards goals with reports of increased ease sit to stand and improved tolerance with walking program, compliant with HEP daily.  Progressed functional strengthening with CKC exercises for proximal hip strengthening and stability.  Most difficulty noted wiht eccentric control stair training.  Pt given advanced HEP as progressing.     Rehab Potential Excellent   Clinical Impairments Affecting Rehab Potential (+) motivated to participate, high PLOF; (-) chronicity of problem, sedentary lifestyle    PT Frequency 2x / week  1x/week due to work schedule   PT Duration 4 weeks   PT Treatment/Interventions ADLs/Self Care Home Management;Gait training;Stair training;Functional mobility training;Therapeutic activities;Therapeutic exercise;Balance training;Neuromuscular re-education;Patient/family education;Manual techniques;Passive range of motion;Energy conservation   PT Next Visit Plan Reassess next session.  F/U with compliance with HEP exercises.  Progress CKC strengthening towards goals.   PT Home Exercise Plan Eval: bridges, prone hip extension,  walking routine, floating SLR, abduction and STS.; 02/14/2017; squats, sidestep with RTB and heel/toe raises.      Patient will benefit from skilled therapeutic intervention in order to improve the following deficits and impairments:  Decreased mobility, Decreased activity tolerance, Decreased strength, Difficulty walking, Improper body mechanics, Impaired flexibility  Visit Diagnosis: Muscle weakness (  generalized)  Other symptoms and signs involving the musculoskeletal system     Problem List Patient Active Problem List   Diagnosis Date Noted  . Esophageal dysphagia 02/02/2017  . Encounter for screening colonoscopy 02/02/2017  . Migraine headache 01/10/2017  . Environmental allergies 01/10/2017  . Chronic GERD 01/10/2017  . Dysphagia 01/10/2017  . Hip pain, chronic, unspecified laterality 01/10/2017  . Varicose veins of left lower extremity 01/10/2017  . Venous insufficiency 01/10/2017   Becky Sax, LPTA; CBIS [phone]  Juel Burrow 02/14/2017, 6:36 PM  [place] [facility] [address] [place], [place], [address] Phone: [phone]   Fax:  [phone]  Name: Melinda English MRN: [id number] Date of Birth: 05-Sep-1955

## 2017-02-14 NOTE — Patient Instructions (Signed)
Toe / Heel Raise (Standing)    Standing with support, raise heels, then rock back on heels and raise toes. Repeat 15 times.  Copyright  VHI. All rights reserved.   FUNCTIONAL MOBILITY: Squat    Stance: shoulder-width on floor. Bend hips and knees. Keep back straight. Do not allow knees to bend past toes. Squeeze glutes and quads to stand. 10 reps per set, 2 sets per day, 4 days per week  Copyright  VHI. All rights reserved.   Band Walk: Side Stepping    Tie band around legs, just above knees. Step 10-15 feet to one side, then step back to start. Note: Small towel between band and skin eases rubbing.  http://plyo.exer.us/76   Copyright  VHI. All rights reserved.

## 2017-02-14 NOTE — Telephone Encounter (Signed)
Seen [phone] 

## 2017-02-20 ENCOUNTER — Telehealth (HOSPITAL_COMMUNITY): Payer: Self-pay | Admitting: Family Medicine

## 2017-02-20 NOTE — Telephone Encounter (Signed)
02/20/17  having vein surgery and the following week surgery on her other leg... will call back when she's ready to re-schedule

## 2017-02-21 ENCOUNTER — Ambulatory Visit (HOSPITAL_COMMUNITY): Payer: Federal, State, Local not specified - PPO

## 2017-02-21 DIAGNOSIS — I8311 Varicose veins of right lower extremity with inflammation: Secondary | ICD-10-CM | POA: Diagnosis not present

## 2017-02-22 ENCOUNTER — Ambulatory Visit (HOSPITAL_COMMUNITY): Payer: Federal, State, Local not specified - PPO | Admitting: Physical Therapy

## 2017-02-22 DIAGNOSIS — I8311 Varicose veins of right lower extremity with inflammation: Secondary | ICD-10-CM | POA: Diagnosis not present

## 2017-02-23 ENCOUNTER — Encounter: Payer: Federal, State, Local not specified - PPO | Admitting: Family Medicine

## 2017-02-28 DIAGNOSIS — Z8669 Personal history of other diseases of the nervous system and sense organs: Secondary | ICD-10-CM | POA: Diagnosis not present

## 2017-02-28 DIAGNOSIS — H2513 Age-related nuclear cataract, bilateral: Secondary | ICD-10-CM | POA: Diagnosis not present

## 2017-03-01 ENCOUNTER — Encounter (HOSPITAL_COMMUNITY): Payer: Federal, State, Local not specified - PPO | Admitting: Physical Therapy

## 2017-03-02 ENCOUNTER — Encounter: Payer: Self-pay | Admitting: Family Medicine

## 2017-03-02 ENCOUNTER — Ambulatory Visit (INDEPENDENT_AMBULATORY_CARE_PROVIDER_SITE_OTHER): Payer: Federal, State, Local not specified - PPO | Admitting: Family Medicine

## 2017-03-02 VITALS — BP 132/74 | HR 80 | Temp 97.0°F | Resp 16 | Ht 67.0 in | Wt 179.1 lb

## 2017-03-02 DIAGNOSIS — Z0000 Encounter for general adult medical examination without abnormal findings: Secondary | ICD-10-CM | POA: Diagnosis not present

## 2017-03-02 NOTE — Patient Instructions (Signed)
Remember the mammogram  Consider a flu shot  You are doing very well, keep up the good work  See me yearly  Call sooner for problems

## 2017-03-02 NOTE — Progress Notes (Signed)
Chief Complaint  Patient presents with  . Annual Exam  Mammo ordered Colo scheduled Is exercising Discussed labs.  Discussed cholesterol.  Framingham risk score for CVD over 10 years in 9 %.  No need for med but diet and exercise is reviewed Recent vein ablation with good result Flu shot discussion.  Refuses  Patient Active Problem List   Diagnosis Date Noted  . Esophageal dysphagia 02/02/2017  . Encounter for screening colonoscopy 02/02/2017  . Migraine headache 01/10/2017  . Environmental allergies 01/10/2017  . Chronic GERD 01/10/2017  . Dysphagia 01/10/2017  . Hip pain, chronic, unspecified laterality 01/10/2017  . Varicose veins of left lower extremity 01/10/2017  . Venous insufficiency 01/10/2017    Outpatient Encounter Prescriptions as of 03/02/2017  Medication Sig  . Ascorbic Acid (VITAMIN C) 1000 MG tablet Take 1,000 mg by mouth daily.  . ASTRAGALUS PO Take by mouth as needed.  Marland Kitchen CALCIUM-MAGNESIUM-ZINC PO Take 1 tablet by mouth daily.  . cetirizine (ZYRTEC) 10 MG tablet Take 10 mg by mouth daily.  Marland Kitchen ibuprofen (ADVIL,MOTRIN) 200 MG tablet Take 200 mg by mouth every 6 (six) hours as needed.  . Misc Natural Products (OSTEO BI-FLEX JOINT SHIELD PO) Take by mouth.  . SUMAtriptan (IMITREX) 100 MG tablet TAKE 1 TABLET BY MOUTH DAILY AS NEEDED FOR MIGRAINE. MAY REPEAT IN 2 HOURS IF HEADACHE PERSISTS  . VITAMIN E PO Take 1 tablet by mouth daily as needed.   No facility-administered encounter medications on file as of 03/02/2017.     No Known Allergies  Review of Systems  Constitutional: Negative for activity change, appetite change and unexpected weight change.  HENT: Negative for congestion, dental problem, postnasal drip and rhinorrhea.   Eyes: Negative for redness and visual disturbance.  Respiratory: Negative for cough and shortness of breath.   Cardiovascular: Positive for leg swelling. Negative for chest pain and palpitations.  Gastrointestinal: Negative for  abdominal pain, constipation and diarrhea.  Genitourinary: Negative for difficulty urinating, frequency and menstrual problem.  Musculoskeletal: Negative for arthralgias and back pain.  Neurological: Negative for dizziness and headaches.  Psychiatric/Behavioral: Negative for dysphoric mood and sleep disturbance. The patient is not nervous/anxious.     BP 132/74 (BP Location: Right Arm, Patient Position: Sitting, Cuff Size: Normal)   Pulse 80   Temp (!) 97 F (36.1 C) (Temporal)   Resp 16   Ht  (1.702 m)   Wt 179 lb 1.3 oz (81.2 kg)   SpO2 97%   BMI 28.05 kg/m   Physical Exam  ASSESSMENT/PLAN:  1. Annual physical exam BP 132/74 (BP Location: Right Arm, Patient Position: Sitting, Cuff Size: Normal)   Pulse 80   Temp (!) 97 F (36.1 C) (Temporal)   Resp 16   Ht  (1.702 m)   Wt 179 lb 1.3 oz (81.2 kg)   SpO2 97%   BMI 28.05 kg/m   General Appearance:    Alert, cooperative, no distress, appears stated age  Head:    Normocephalic, without obvious abnormality, atraumatic  Eyes:    PERRL, conjunctiva/corneas clear, EOM's intact, fundi    benign, both eyes  Ears:    Normal TM's and external ear canals, both ears  Nose:   Nares normal, septum midline, mucosa normal, no drainage    or sinus tenderness  Throat:   Lips, mucosa, and tongue normal; teeth and gums normal  Neck:   Supple, symmetrical, trachea midline, no adenopathy;    thyroid:  no enlargement/tenderness/nodules; no  carotid   bruit or JVD  Back:     Symmetric, no curvature, ROM normal, no CVA tenderness  Lungs:     Clear to auscultation bilaterally, respirations unlabored  Chest Wall:    No tenderness or deformity   Heart:    Regular rate and rhythm, S1 and S2 normal, no murmur, rub   or gallop  Breast Exam:    No tenderness, masses, or nipple abnormality  Abdomen:     Soft, non-tender, bowel sounds active all four quadrants,    no masses, no organomegaly  Extremities:   Extremities normal, atraumatic,  no cyanosis or edema  Pulses:   2+ and symmetric all extremities  Skin:   Skin color, texture, turgor normal, no rashes or lesions. Discussed tan/sunscreen  Lymph nodes:   Cervical, supraclavicular, and axillary nodes normal  Neurologic:   CNII-XII intact, normal strength, sensation and reflexes    throughout     Patient Instructions  Remember the mammogram  Consider a flu shot  You are doing very well, keep up the good work  See me yearly  Call sooner for problems   Eustace Moore, MD

## 2017-03-07 DIAGNOSIS — I8312 Varicose veins of left lower extremity with inflammation: Secondary | ICD-10-CM | POA: Diagnosis not present

## 2017-03-09 DIAGNOSIS — I8312 Varicose veins of left lower extremity with inflammation: Secondary | ICD-10-CM | POA: Diagnosis not present

## 2017-03-13 ENCOUNTER — Ambulatory Visit (HOSPITAL_COMMUNITY)
Admission: RE | Admit: 2017-03-13 | Discharge: 2017-03-13 | Disposition: A | Discharge status: Home / Self Care | Payer: Federal, State, Local not specified - PPO | Source: Ambulatory Visit | Attending: Family Medicine | Admitting: Family Medicine

## 2017-03-13 DIAGNOSIS — Z1239 Encounter for other screening for malignant neoplasm of breast: Secondary | ICD-10-CM

## 2017-03-13 DIAGNOSIS — Z1231 Encounter for screening mammogram for malignant neoplasm of breast: Secondary | ICD-10-CM | POA: Insufficient documentation

## 2017-03-22 ENCOUNTER — Encounter: Payer: Self-pay | Admitting: Family Medicine

## 2017-04-05 DIAGNOSIS — I8311 Varicose veins of right lower extremity with inflammation: Secondary | ICD-10-CM | POA: Diagnosis not present

## 2017-04-18 DIAGNOSIS — L570 Actinic keratosis: Secondary | ICD-10-CM | POA: Diagnosis not present

## 2017-04-18 DIAGNOSIS — L814 Other melanin hyperpigmentation: Secondary | ICD-10-CM | POA: Diagnosis not present

## 2017-04-18 DIAGNOSIS — D225 Melanocytic nevi of trunk: Secondary | ICD-10-CM | POA: Diagnosis not present

## 2017-04-18 DIAGNOSIS — D1801 Hemangioma of skin and subcutaneous tissue: Secondary | ICD-10-CM | POA: Diagnosis not present

## 2017-04-18 DIAGNOSIS — L821 Other seborrheic keratosis: Secondary | ICD-10-CM | POA: Diagnosis not present

## 2017-04-25 DIAGNOSIS — I8311 Varicose veins of right lower extremity with inflammation: Secondary | ICD-10-CM | POA: Diagnosis not present

## 2017-04-27 ENCOUNTER — Encounter (HOSPITAL_COMMUNITY): Payer: Self-pay | Admitting: Physical Therapy

## 2017-04-27 NOTE — Therapy (Signed)
[place], [place], [address] Phone: [phone]   Fax:  [phone]  Patient Details  Name: Melinda English MRN: [id number] Date of Birth: [birth year] Referring Provider:  No ref. provider found  Encounter Date: 04/27/2017   PHYSICAL THERAPY DISCHARGE SUMMARY  Visits from Start of Care: 3  Current functional level related to goals / functional outcomes: Patient self-discharged due to having upcoming vascular surgery. DC per patient request.    Remaining deficits: Unable to assess    Education / Equipment: None  Plan: Patient agrees to discharge.  Patient goals were not met. Patient is being discharged due to the patient's request.  ?????      Deniece Ree PT, DPT, CBIS  Supplemental Physical Therapist [place], [place], [address] Phone: [phone]   Fax:  [phone]

## 2017-05-08 ENCOUNTER — Other Ambulatory Visit (INDEPENDENT_AMBULATORY_CARE_PROVIDER_SITE_OTHER): Payer: Self-pay | Admitting: Internal Medicine

## 2017-05-08 MED ORDER — PEG 3350-KCL-NA BICARB-NACL 420 G PO SOLR: 4000.0000 mL | Freq: Once | ORAL | 0 refills | Status: AC

## 2017-05-10 ENCOUNTER — Other Ambulatory Visit: Payer: Self-pay

## 2017-05-10 ENCOUNTER — Encounter (HOSPITAL_COMMUNITY)
Admission: RE | Disposition: A | Discharge status: Home / Self Care | Payer: Self-pay | Source: Ambulatory Visit | Attending: Internal Medicine

## 2017-05-10 ENCOUNTER — Encounter (HOSPITAL_COMMUNITY): Payer: Self-pay | Admitting: *Deleted

## 2017-05-10 ENCOUNTER — Ambulatory Visit (HOSPITAL_COMMUNITY)
Admission: RE | Admit: 2017-05-10 | Discharge: 2017-05-10 | Disposition: A | Discharge status: Home / Self Care | Payer: Federal, State, Local not specified - PPO | Source: Ambulatory Visit | Attending: Internal Medicine | Admitting: Internal Medicine

## 2017-05-10 DIAGNOSIS — R1314 Dysphagia, pharyngoesophageal phase: Secondary | ICD-10-CM | POA: Diagnosis not present

## 2017-05-10 DIAGNOSIS — Z79899 Other long term (current) drug therapy: Secondary | ICD-10-CM | POA: Diagnosis not present

## 2017-05-10 DIAGNOSIS — K648 Other hemorrhoids: Secondary | ICD-10-CM | POA: Insufficient documentation

## 2017-05-10 DIAGNOSIS — K222 Esophageal obstruction: Secondary | ICD-10-CM | POA: Insufficient documentation

## 2017-05-10 DIAGNOSIS — K210 Gastro-esophageal reflux disease with esophagitis: Secondary | ICD-10-CM | POA: Diagnosis not present

## 2017-05-10 DIAGNOSIS — K6389 Other specified diseases of intestine: Secondary | ICD-10-CM | POA: Insufficient documentation

## 2017-05-10 DIAGNOSIS — E039 Hypothyroidism, unspecified: Secondary | ICD-10-CM | POA: Insufficient documentation

## 2017-05-10 DIAGNOSIS — R1319 Other dysphagia: Secondary | ICD-10-CM

## 2017-05-10 DIAGNOSIS — R1310 Dysphagia, unspecified: Secondary | ICD-10-CM | POA: Insufficient documentation

## 2017-05-10 DIAGNOSIS — K449 Diaphragmatic hernia without obstruction or gangrene: Secondary | ICD-10-CM | POA: Insufficient documentation

## 2017-05-10 DIAGNOSIS — Z980 Intestinal bypass and anastomosis status: Secondary | ICD-10-CM | POA: Diagnosis not present

## 2017-05-10 DIAGNOSIS — Z1211 Encounter for screening for malignant neoplasm of colon: Secondary | ICD-10-CM | POA: Diagnosis not present

## 2017-05-10 HISTORY — DX: Other specified postprocedural states: Z98.890

## 2017-05-10 HISTORY — PX: ESOPHAGEAL DILATION: SHX303

## 2017-05-10 HISTORY — PX: ESOPHAGOGASTRODUODENOSCOPY: SHX5428

## 2017-05-10 HISTORY — DX: Nausea with vomiting, unspecified: R11.2

## 2017-05-10 HISTORY — PX: COLONOSCOPY: SHX5424

## 2017-05-10 SURGERY — COLONOSCOPY
Anesthesia: Moderate Sedation

## 2017-05-10 MED ORDER — MIDAZOLAM HCL 5 MG/5ML IJ SOLN
INTRAMUSCULAR | Status: AC
  Filled 2017-05-10: qty 5

## 2017-05-10 MED ORDER — MEPERIDINE HCL 50 MG/ML IJ SOLN
INTRAMUSCULAR | Status: DC | PRN
  Administered 2017-05-10 (×4): 25 mg

## 2017-05-10 MED ORDER — LIDOCAINE VISCOUS 2 % MT SOLN
OROMUCOSAL | Status: AC
  Filled 2017-05-10: qty 15

## 2017-05-10 MED ORDER — LIDOCAINE VISCOUS 2 % MT SOLN
OROMUCOSAL | Status: DC | PRN
  Administered 2017-05-10: 1 via OROMUCOSAL

## 2017-05-10 MED ORDER — STERILE WATER FOR IRRIGATION IR SOLN
Status: DC | PRN
  Administered 2017-05-10: 14:00:00

## 2017-05-10 MED ORDER — MEPERIDINE HCL 50 MG/ML IJ SOLN
INTRAMUSCULAR | Status: AC
  Filled 2017-05-10: qty 1

## 2017-05-10 MED ORDER — MIDAZOLAM HCL 5 MG/5ML IJ SOLN
INTRAMUSCULAR | Status: DC | PRN
  Administered 2017-05-10 (×2): 2 mg via INTRAVENOUS
  Administered 2017-05-10: 1 mg via INTRAVENOUS
  Administered 2017-05-10 (×5): 2 mg via INTRAVENOUS

## 2017-05-10 MED ORDER — PANTOPRAZOLE SODIUM 40 MG PO TBEC: 40.0000 mg | DELAYED_RELEASE_TABLET | Freq: Every day | ORAL | 5 refills | Status: DC

## 2017-05-10 MED ORDER — MIDAZOLAM HCL 5 MG/5ML IJ SOLN
INTRAMUSCULAR | Status: AC
  Filled 2017-05-10: qty 10

## 2017-05-10 MED ORDER — SODIUM CHLORIDE 0.9 % IV SOLN
INTRAVENOUS | Status: DC
  Administered 2017-05-10: 1000 mL via INTRAVENOUS

## 2017-05-10 NOTE — H&P (Signed)
Melinda ContesDeborah English is an 61 y.o. female.   Chief Complaint: Patient is here for EGD, EGD and colonoscopy. HPI: 61 year old Caucasian female who presents with.  History of intermittent solid food dysphagia.  She has difficulty with bread and meat.  Points to the lower sternal area as site of bolus obstruction.  She says food bolus passes down with a sip of water spontaneously.  She is never had an episode where she had to regurgitate food for relief.  She rarely experiences heartburn with certain food.  She denies nausea vomiting or melena.  She is also undergoing screening colonoscopy.  Last exam was 14 years ago.  She denies rectal bleeding or recent change in her bowel habits. Family history is negative for CRC.  Past Medical History:  Diagnosis Date  . Chronic rhinitis    Allergy testing 2018: positives to weeds,trees, one mold, and dust mites.  . Chronic venous hypertension w/o comp of bilateral low extrm 09/2016   [place] [address]  . Family history of abdominal aortic aneurysm 01/2015   Screening aortic u/s NEG  . Glaucoma   . History of hypothyroidism; recent lab normal of therapy.    was on armour thyroid 60mg  qd at one point--then stopped taking med old records don't have any f/u thyroid labs after this  . History of narrow angle glaucoma    angle narrowing  . Migraines   .       Marland Kitchen. PONV (postoperative nausea and vomiting)   .       .    .    . Urine incontinence   . Venous stasis of lower extremity 09/2016   Right    Past Surgical History:  Procedure Laterality Date  . ABDOMINAL HYSTERECTOMY  2004   with unilateral salpingooopherectomy (endometriosis)  . CHOLECYSTECTOMY  2004  . COLONOSCOPY  10/2002   Normal  . EYE SURGERY Bilateral 2004   for narrow angle glaucoma--laser   . INCONTINENCE SURGERY    . OOPHORECTOMY Right 1988   dermoid cyst  . PARTIAL COLECTOMY  10/2002   Segmental resection of sigmoid.  Endometriosis was on colon as well--approx 6  inches.    Family History  Problem Relation Age of Onset  . Arthritis Mother   . Heart disease Father   . Atrial fibrillation Father   . Diabetes Father   . AAA (abdominal aortic aneurysm) Father   . Cancer Father        pancoast tumor  . Breast cancer Sister   . Cancer Sister   . Colon cancer Maternal Uncle   . Colon cancer Maternal Uncle   . Diabetes Daughter   . Allergic rhinitis Neg Hx   . Asthma Neg Hx   . Atopy Neg Hx   . Angioedema Neg Hx   . Eczema Neg Hx   . Immunodeficiency Neg Hx   . Urticaria Neg Hx    Social History:  reports that  has never smoked. she has never used smokeless tobacco. She reports that she does not drink alcohol or use drugs.  Allergies: No Known Allergies  Medications Prior to Admission  Medication Sig Dispense Refill  . Ascorbic Acid (VITAMIN C) 1000 MG tablet Take 1,000 mg by mouth daily.    Marland Kitchen. CALCIUM-MAGNESIUM-ZINC PO Take 1 tablet by mouth daily.    . cetirizine (ZYRTEC) 10 MG tablet Take 10 mg by mouth daily as needed for allergies.     Marland Kitchen. ibuprofen (ADVIL,MOTRIN) 200 MG tablet  Take 200 mg by mouth every 6 (six) hours as needed.    . Misc Natural Products (OSTEO BI-FLEX JOINT SHIELD PO) Take 1 tablet by mouth daily.     . NON FORMULARY Take 1 tablet by mouth daily. Hydraplenish. ONe a day     . Riboflavin (B2) 100 MG TABS Take 1 tablet by mouth daily.    . SUMAtriptan (IMITREX) 100 MG tablet TAKE 1 TABLET BY MOUTH DAILY AS NEEDED FOR MIGRAINE. MAY REPEAT IN 2 HOURS IF HEADACHE PERSISTS 10 tablet 3  . ASTRAGALUS PO Take 1 capsule by mouth daily as needed (boost immune system).     . NON FORMULARY as needed. Bio E with Selenium    . VITAMIN E PO Take 1 tablet by mouth daily as needed.      No results found for this or any previous visit (from the past 48 hour(s)). No results found.  ROS  Blood pressure (!) 142/78, pulse 78, temperature 98.9 F (37.2 C), temperature source Oral, resp. rate 14. Physical Exam  Constitutional: She  appears well-developed and well-nourished.  HENT:  Mouth/Throat: Oropharynx is clear and moist.  Eyes: Conjunctivae are normal. No scleral icterus.  Neck: No thyromegaly present.  Cardiovascular: Normal rate, regular rhythm and normal heart sounds.  No murmur heard. Respiratory: Effort normal and breath sounds normal.  GI: Soft. She exhibits no distension and no mass. There is no tenderness.  Musculoskeletal: She exhibits no edema.  Lymphadenopathy:    She has no cervical adenopathy.  Neurological: She is alert.  Skin: Skin is warm and dry.     Assessment/Plan Solid food dysphagia. EGD with ED and average risk screening colonoscopy.  Lionel DecemberNajeeb Audie Stayer, MD 05/10/2017, 1:33 PM

## 2017-05-10 NOTE — Op Note (Signed)
[facility] Patient Name: Melinda English Procedure Date: 05/10/2017 1:25 PM MRN: [id number] Date of Birth: 1955-09-26 Attending MD: Lionel December , MD CSN: [id number] Age: 61 Admit Type: Outpatient Procedure:                Upper GI endoscopy Indications:              Esophageal dysphagia Providers:                Lionel December, MD, Jannett Celestine, RN, Burke Keels,                            Technician Referring MD:             Eustace Moore, MD Medicines:                Lidocaine spray, Meperidine 75 mg IV, Midazolam 13                            mg IV Complications:            No immediate complications. Estimated Blood Loss:     Estimated blood loss was minimal. Procedure:                Pre-Anesthesia Assessment:                           - Prior to the procedure, a History and Physical                            was performed, and patient medications and                            allergies were reviewed. The patient's tolerance of                            previous anesthesia was also reviewed. The risks                            and benefits of the procedure and the sedation                            options and risks were discussed with the patient.                            All questions were answered, and informed consent                            was obtained. Prior Anticoagulants: The patient has                            taken no previous anticoagulant or antiplatelet                            agents. ASA Grade Assessment: I - A normal, healthy  patient. After reviewing the risks and benefits,                            the patient was deemed in satisfactory condition to                            undergo the procedure.                           After obtaining informed consent, the endoscope was                            passed under direct vision. Throughout the                            procedure, the patient's blood  pressure, pulse, and                            oxygen saturations were monitored continuously. The                            [id number]) was introduced through the mouth,                            and advanced to the second part of duodenum. The                            patient tolerated the procedure fairly well. Scope In: 1:48:56 PM Scope Out: 2:05:02 PM Total Procedure Duration: 0 hours 16 minutes 6 seconds  Findings:      The examined esophagus was normal.      A moderate Schatzki ring (acquired) was found at the gastroesophageal       junction. The scope was withdrawn. Dilation was performed with a Maloney       dilator with mild resistance at 54 Fr. The dilation site was examined       following endoscope reinsertion and showed moderate improvement in       luminal narrowing and no perforation.      Erythema and edema esophagitis was found 37 cm from the incisors.      A 3 cm hiatal hernia was present.      The entire examined stomach was normal.      The duodenal bulb and second portion of the duodenum were normal. Impression:               - Normal esophagus.                           - Moderate Schatzki ring. Dilated.                           - Mild esophagitis at GEJ.                           - 3 cm hiatal hernia.                           -  Normal stomach.                           - Normal duodenal bulb and second portion of the                            duodenum.                           - No specimens collected. Moderate Sedation:      Moderate (conscious) sedation was administered by the endoscopy nurse       and supervised by the endoscopist. The following parameters were       monitored: oxygen saturation, heart rate, blood pressure, CO2       capnography and response to care. Total physician intraservice time was       17 minutes. Recommendation:           - Patient has a contact number available for                            emergencies. The signs  and symptoms of potential                            delayed complications were discussed with the                            patient. Return to normal activities tomorrow.                            Written discharge instructions were provided to the                            patient.                           - Resume previous diet today.                           - Continue present medications.                           - Pantoprazole 40 mg po qam.                           - Repeat upper endoscopy PRN.                           - OV in 4 months. Procedure Code(s):        --- Professional ---                           [phone], Esophagogastroduodenoscopy, flexible,                            transoral; diagnostic, including collection of  specimen(s) by brushing or washing, when performed                            (separate procedure)                           43450, Dilation of esophagus, by unguided sound or                            bougie, single or multiple passes                           99152, Moderate sedation services provided by the                            same physician or other qualified health care                            professional performing the diagnostic or                            therapeutic service that the sedation supports,                            requiring the presence of an independent trained                            observer to assist in the monitoring of the                            patient's level of consciousness and physiological                            status; initial 15 minutes of intraservice time,                            patient age 30 years or older Diagnosis Code(s):        --- Professional ---                           K22.2, Esophageal obstruction                           K21.0, Gastro-esophageal reflux disease with                            esophagitis                           K44.9,  Diaphragmatic hernia without obstruction or                            gangrene                           R13.14, Dysphagia, pharyngoesophageal phase CPT copyright 2016 American Medical Association.  All rights reserved. The codes documented in this report are preliminary and upon coder review may  be revised to meet current compliance requirements. Lionel December, MD Lionel December, MD 05/10/2017 2:45:42 PM This report has been signed electronically. Number of Addenda: 0

## 2017-05-10 NOTE — Op Note (Signed)
[facility] Patient Name: Melinda ContesDeborah Orton Procedure Date: 05/10/2017 2:07 PM MRN: [id number] Date of Birth: 03/06/1956 Attending MD: Lionel DecemberNajeeb Nekia Maxham , MD CSN: [id number] Age: 61 Admit Type: Outpatient Procedure:                Colonoscopy Indications:              Screening for colorectal malignant neoplasm Providers:                Lionel DecemberNajeeb Ja Ohman, MD, Jannett CelestineAnitra Bell, RN, Burke Keelsrisann Tilley,                            Technician Referring MD:             Eustace MooreYvonne Sue Nelson, MD Medicines:                Midazolam 2 mg IV Complications:            No immediate complications. Estimated Blood Loss:     Estimated blood loss: none. Procedure:                Pre-Anesthesia Assessment:                           - Prior to the procedure, a History and Physical                            was performed, and patient medications and                            allergies were reviewed. The patient's tolerance of                            previous anesthesia was also reviewed. The risks                            and benefits of the procedure and the sedation                            options and risks were discussed with the patient.                            All questions were answered, and informed consent                            was obtained. Prior Anticoagulants: The patient has                            taken no previous anticoagulant or antiplatelet                            agents. ASA Grade Assessment: I - A normal, healthy                            patient. After reviewing the risks and benefits,  the patient was deemed in satisfactory condition to                            undergo the procedure.                           After obtaining informed consent, the colonoscope                            was passed under direct vision. Throughout the                            procedure, the patient's blood pressure, pulse, and                            oxygen  saturations were monitored continuously. The                            [id number] ([id number]) scope was introduced through                            the anus and advanced to the the cecum, identified                            by appendiceal orifice and ileocecal valve. The                            colonoscopy was somewhat difficult due to                            restricted mobility of the colon. Successful                            completion of the procedure was aided by changing                            the patient to a supine position and using manual                            pressure. The quality of the bowel preparation was                            adequate. The ileocecal valve, appendiceal orifice,                            and rectum were photographed. Scope In: 2:08:30 PM Scope Out: 2:33:55 PM Scope Withdrawal Time: 0 hours 9 minutes 31 seconds  Total Procedure Duration: 0 hours 25 minutes 25 seconds  Findings:      The perianal and digital rectal examinations were normal.      A diffuse area of mild melanosis was found in the entire colon.      There was evidence of a prior end-to-end colo-colonic anastomosis at 20       cm proximal to the anus. This was patent  and was characterized by       healthy appearing mucosa.      The exam was otherwise normal throughout the examined colon.      Internal hemorrhoids were found during retroflexion. The hemorrhoids       were small. Impression:               - Melanosis in the colon.                           - Patent end-to-end colo-colonic anastomosis,                            characterized by healthy appearing mucosa.                           - Internal hemorrhoids.                           - No specimens collected. Moderate Sedation:      Moderate (conscious) sedation was administered by the endoscopy nurse       and supervised by the endoscopist. The following parameters were       monitored: oxygen saturation,  heart rate, blood pressure, CO2       capnography and response to care. Total physician intraservice time was       20 minutes. Recommendation:           - Patient has a contact number available for                            emergencies. The signs and symptoms of potential                            delayed complications were discussed with the                            patient. Return to normal activities tomorrow.                            Written discharge instructions were provided to the                            patient.                           - Resume previous diet today.                           - Continue present medications.                           - Repeat colonoscopy in 10 years for screening                            purposes. Procedure Code(s):        --- Professional ---  [phone], Colonoscopy, flexible; diagnostic, including                            collection of specimen(s) by brushing or washing,                            when performed (separate procedure)                           99152, Moderate sedation services provided by the                            same physician or other qualified health care                            professional performing the diagnostic or                            therapeutic service that the sedation supports,                            requiring the presence of an independent trained                            observer to assist in the monitoring of the                            patient's level of consciousness and physiological                            status; initial 15 minutes of intraservice time,                            patient age 55 years or older Diagnosis Code(s):        --- Professional ---                           Z12.11, Encounter for screening for malignant                            neoplasm of colon                           K63.89, Other specified diseases of intestine                            Z98.0, Intestinal bypass and anastomosis status                           K64.8, Other hemorrhoids CPT copyright 2016 American Medical Association. All rights reserved. The codes documented in this report are preliminary and upon coder review may  be revised to meet current compliance requirements. Lionel December, MD Lionel December, MD 05/10/2017 2:50:42 PM This report has been signed electronically. Number of Addenda: 0

## 2017-05-10 NOTE — Discharge Instructions (Signed)
Resume usual medications and diet as before. Pantoprazole 40 mg by mouth 30 minutes before breakfast daily. No driving for 24 hours. Office visit in 4 months. Next screening colonoscopy in 10 years.  Colonoscopy, Adult, Care After This sheet gives you information about how to care for yourself after your procedure. Your health care provider may also give you more specific instructions. If you have problems or questions, contact your health care provider. What can I expect after the procedure? After the procedure, it is common to have:  A small amount of blood in your stool for 24 hours after the procedure.  Some gas.  Mild abdominal cramping or bloating.  Follow these instructions at home: General instructions   For the first 24 hours after the procedure: ? Do not drive or use machinery. ? Do not sign important documents. ? Do not drink alcohol. ? Do your regular daily activities at a slower pace than normal. ? Eat soft, easy-to-digest foods. ? Rest often.  Take over-the-counter or prescription medicines only as told by your health care provider.  It is up to you to get the results of your procedure. Ask your health care provider, or the department performing the procedure, when your results will be ready. Relieving cramping and bloating  Try walking around when you have cramps or feel bloated.  Apply heat to your abdomen as told by your health care provider. Use a heat source that your health care provider recommends, such as a moist heat pack or a heating pad. ? Place a towel between your skin and the heat source. ? Leave the heat on for 20-30 minutes. ? Remove the heat if your skin turns bright red. This is especially important if you are unable to feel pain, heat, or cold. You may have a greater risk of getting burned. Eating and drinking  Drink enough fluid to keep your urine clear or pale yellow.  Resume your normal diet as instructed by your health care provider.  Avoid heavy or fried foods that are hard to digest.  Avoid drinking alcohol for as long as instructed by your health care provider. Contact a health care provider if:  You have blood in your stool 2-3 days after the procedure. Get help right away if:  You have more than a small spotting of blood in your stool.  You pass large blood clots in your stool.  Your abdomen is swollen.  You have nausea or vomiting.  You have a fever.  You have increasing abdominal pain that is not relieved with medicine. This information is not intended to replace advice given to you by your health care provider. Make sure you discuss any questions you have with your health care provider. Document Released: 01/12/2004 Document Revised: 02/22/2016 Document Reviewed: 08/11/2015 Elsevier Interactive Patient Education  2018 [employer] Gastrointestinal Endoscopy, Care After Refer to this sheet in the next few weeks. These instructions provide you with information about caring for yourself after your procedure. Your health care provider may also give you more specific instructions. Your treatment has been planned according to current medical practices, but problems sometimes occur. Call your health care provider if you have any problems or questions after your procedure. What can I expect after the procedure? After your procedure, it is common to feel:  Bloated.  Soreness in your throat.  Sleepy.  Follow these instructions at home:  Do not drive for 24 hours if you received a if you received a medicine to help you relax (  sedative).  Avoid drinking warm beverages and alcohol for the first 24 hours after the procedure.  Take over-the-counter and prescription medicines only as told by your health care provider.  Drink enough fluids to keep your urine clear or pale yellow.  If you feel bloated, try going for a walk. Walking may help the feeling go away.  If your throat is sore, try gargling with salt  water. Get help right away if:  You have severe nausea or vomiting.  You have severe abdominal pain, abdominal cramps that last longer than 6 hours, or abdominal swelling.  You have severe shoulder or back pain.  You have trouble swallowing.  You have shortness of breath, your breathing is shallow, or you breathing is faster than normal.  You have a fever.  Your heart is beating very fast.  You vomit blood or material that looks like coffee grounds.  You have bloody, black, or tarry stools. This information is not intended to replace advice given to you by your health care provider. Make sure you discuss any questions you have with your health care provider. Document Released: 01/12/2004 Document Revised: 04/06/2016 Document Reviewed: 03/22/2015 Elsevier Interactive Patient Education  2018 [employer]

## 2017-05-11 ENCOUNTER — Encounter (INDEPENDENT_AMBULATORY_CARE_PROVIDER_SITE_OTHER): Payer: Self-pay | Admitting: Internal Medicine

## 2017-05-12 ENCOUNTER — Encounter (HOSPITAL_COMMUNITY): Payer: Self-pay | Admitting: Internal Medicine

## 2017-05-16 DIAGNOSIS — I8312 Varicose veins of left lower extremity with inflammation: Secondary | ICD-10-CM | POA: Diagnosis not present

## 2017-05-30 DIAGNOSIS — I8312 Varicose veins of left lower extremity with inflammation: Secondary | ICD-10-CM | POA: Diagnosis not present

## 2017-06-27 DIAGNOSIS — I8311 Varicose veins of right lower extremity with inflammation: Secondary | ICD-10-CM | POA: Diagnosis not present

## 2017-07-14 ENCOUNTER — Other Ambulatory Visit: Payer: Self-pay

## 2017-07-14 ENCOUNTER — Ambulatory Visit (INDEPENDENT_AMBULATORY_CARE_PROVIDER_SITE_OTHER): Payer: Federal, State, Local not specified - PPO

## 2017-07-14 DIAGNOSIS — N3001 Acute cystitis with hematuria: Secondary | ICD-10-CM

## 2017-07-14 LAB — POCT URINALYSIS DIPSTICK
APPEARANCE: NORMAL
Bilirubin, UA: NEGATIVE
Glucose, UA: NEGATIVE
Ketones, UA: NEGATIVE
NITRITE UA: NEGATIVE
Odor: NORMAL
PH UA: 5.5 (ref 5.0–8.0)
PROTEIN UA: NEGATIVE
Spec Grav, UA: 1.015 (ref 1.010–1.025)
UROBILINOGEN UA: 0.2 U/dL

## 2017-07-14 MED ORDER — CIPROFLOXACIN HCL 500 MG PO TABS: 500.0000 mg | ORAL_TABLET | Freq: Two times a day (BID) | ORAL | 0 refills | Status: DC

## 2017-07-17 DIAGNOSIS — N3001 Acute cystitis with hematuria: Secondary | ICD-10-CM | POA: Diagnosis not present

## 2017-07-20 LAB — URINE CULTURE
MICRO NUMBER:: 90148470
SPECIMEN QUALITY:: ADEQUATE

## 2017-07-25 ENCOUNTER — Telehealth: Payer: Self-pay

## 2017-07-25 NOTE — Telephone Encounter (Signed)
-----   Message from Eustace MooreYvonne Sue Nelson, MD sent at 07/24/2017  1:00 PM EST ----- Sensitive to cipro

## 2017-08-01 ENCOUNTER — Other Ambulatory Visit: Payer: Self-pay | Admitting: Family Medicine

## 2017-08-01 MED ORDER — OSELTAMIVIR PHOSPHATE 75 MG PO CAPS: 75.0000 mg | ORAL_CAPSULE | Freq: Every day | ORAL | 0 refills | Status: DC

## 2017-08-31 ENCOUNTER — Encounter: Payer: Self-pay | Admitting: Family Medicine

## 2017-09-01 MED ORDER — SUMATRIPTAN SUCCINATE 100 MG PO TABS: ORAL_TABLET | ORAL | 0 refills | Status: AC

## 2017-09-07 ENCOUNTER — Ambulatory Visit (INDEPENDENT_AMBULATORY_CARE_PROVIDER_SITE_OTHER): Payer: Federal, State, Local not specified - PPO | Admitting: Internal Medicine

## 2017-09-07 ENCOUNTER — Encounter (INDEPENDENT_AMBULATORY_CARE_PROVIDER_SITE_OTHER): Payer: Self-pay | Admitting: Internal Medicine

## 2017-09-07 VITALS — BP 140/80 | HR 72 | Temp 98.0°F | Ht 66.5 in | Wt 177.3 lb

## 2017-09-07 DIAGNOSIS — R1310 Dysphagia, unspecified: Secondary | ICD-10-CM | POA: Diagnosis not present

## 2017-09-07 DIAGNOSIS — K219 Gastro-esophageal reflux disease without esophagitis: Secondary | ICD-10-CM

## 2017-09-07 NOTE — Patient Instructions (Addendum)
OV one year.  

## 2017-09-07 NOTE — Progress Notes (Signed)
Subjective:    Patient ID: Melinda English, female    DOB: 04/15/1956, 62 y.o.   MRN: [id number]  HPI Here today for f/u. Underwent and EGD/Colonoscopy in November of 2018. Colonoscopy (screening):  Impression:               - Melanosis in the colon.                           - Patent end-to-end colo-colonic anastomosis,                            characterized by healthy appearing mucosa.                           - Internal hemorrhoids.  EGD/ED November revealed: Normal esophagus. moderate Schatzki ring which was dilated.  3cm hiatal hernia. Normal stomach.   She tells me she is better .She says afterwards, her GERD was worse. She is not taking anything for her GERD now. Occasionally has GERD at night. Her appetite is good. No weight.  She stopped the Protonix. The Protonix caused a headache.    Review of Systems Past Medical History:  Diagnosis Date  . Chronic rhinitis    Allergy testing 2018: positives to weeds,trees, one mold, and dust mites.  . Chronic venous hypertension w/o comp of bilateral low extrm 09/2016   Vein/vascular clinic in [place] [address]  . Family history of abdominal aortic aneurysm 01/2015   Screening aortic u/s NEG  . Glaucoma   . History of hypothyroidism    was on armour thyroid 60mg  qd at one point--then stopped taking med old records don't have any f/u thyroid labs after this  . History of narrow angle glaucoma    angle narrowing  . Migraines   . Mitral valve prolapse    with trace regurg per old records  . PONV (postoperative nausea and vomiting)   . Pruritus 07/2016   with erythema: rx'd high dose antihistamines bid by allergist  . [place], normal   . Thyroid disease   . Urine incontinence   . Venous stasis of lower extremity 09/2016   Right    Past Surgical History:  Procedure Laterality Date  . ABDOMINAL HYSTERECTOMY  2004   with unilateral salpingooopherectomy (endometriosis)  . CHOLECYSTECTOMY  2004  . COLONOSCOPY  10/2002   Normal    . COLONOSCOPY N/A 05/10/2017   Procedure: COLONOSCOPY;  Surgeon: Malissa Hippo, MD;  Location: [facility];  Service: Endoscopy;  Laterality: N/A;  1:00  . ESOPHAGEAL DILATION N/A 05/10/2017   Procedure: ESOPHAGEAL DILATION;  Surgeon: Malissa Hippo, MD;  Location: [facility];  Service: Endoscopy;  Laterality: N/A;  . ESOPHAGOGASTRODUODENOSCOPY N/A 05/10/2017   Procedure: ESOPHAGOGASTRODUODENOSCOPY (EGD);  Surgeon: Malissa Hippo, MD;  Location: [facility];  Service: Endoscopy;  Laterality: N/A;  . EYE SURGERY Bilateral 2004   for narrow angle glaucoma--laser   . INCONTINENCE SURGERY    . OOPHORECTOMY Right 1988   dermoid cyst  . PARTIAL COLECTOMY  10/2002   Segmental resection of sigmoid.  Endometriosis was on colon as well--approx 6 inches.    No Known Allergies  Current Outpatient Medications on File Prior to Visit  Medication Sig Dispense Refill  . Ascorbic Acid (VITAMIN C) 1000 MG tablet Take 1,000 mg by mouth daily.    . ASTRAGALUS PO  Take 1 capsule by mouth daily as needed (boost immune system).     . CALCIUM-MAGNESIUM-ZINC PO Take 1 tablet by mouth daily.    . cetirizine (ZYRTEC) 10 MG tablet Take 10 mg by mouth daily as needed for allergies.     . ciprofloxacin (CIPRO) 500 MG tablet Take 1 tablet (500 mg total) by mouth 2 (two) times daily. 6 tablet 0  . ibuprofen (ADVIL,MOTRIN) 200 MG tablet Take 200 mg by mouth every 6 (six) hours as needed.    . Misc Natural Products (OSTEO BI-FLEX JOINT SHIELD PO) Take 1 tablet by mouth daily.     . NON FORMULARY as needed. Bio E with Selenium    . NON FORMULARY Take 1 tablet by mouth daily. Hydraplenish. ONe a day     . pantoprazole (PROTONIX) 40 MG tablet Take 1 tablet (40 mg total) by mouth daily before breakfast. 30 tablet 5  . Riboflavin (B2) 100 MG TABS Take 1 tablet by mouth daily.    . SUMAtriptan (IMITREX) 100 MG tablet TAKE 1 TABLET BY MOUTH DAILY AS NEEDED FOR MIGRAINE. MAY REPEAT IN 2 HOURS IF HEADACHE PERSISTS  20 tablet 0  . VITAMIN E PO Take 1 tablet by mouth daily as needed.     No current facility-administered medications on file prior to visit.         Objective:   Physical Exam Blood pressure 140/80, pulse 72, temperature 98 F (36.7 C), height 5' 6.5" (1.689 m), weight 177 lb 4.8 oz (80.4 kg). Alert and oriented. Skin warm and dry. Oral mucosa is moist.   . Sclera anicteric, conjunctivae is pink. Thyroid not enlarged. No cervical lymphadenopathy. Lungs clear. Heart regular rate and rhythm.  Abdomen is soft. Bowel sounds are positive. No hepatomegaly. No abdominal masses felt. No tenderness.  No edema to lower extremities.           Assessment & Plan:  Dysphagia. She is doing well. She is not having any trouble swallowing. OV in 1 year.

## 2017-11-27 ENCOUNTER — Other Ambulatory Visit: Payer: Self-pay | Admitting: Family Medicine

## 2017-11-30 ENCOUNTER — Other Ambulatory Visit: Payer: Self-pay | Admitting: Family Medicine

## 2017-11-30 ENCOUNTER — Encounter: Payer: Self-pay | Admitting: Family Medicine

## 2018-02-05 DIAGNOSIS — G43009 Migraine without aura, not intractable, without status migrainosus: Secondary | ICD-10-CM | POA: Diagnosis not present

## 2018-02-05 DIAGNOSIS — Z6829 Body mass index (BMI) 29.0-29.9, adult: Secondary | ICD-10-CM | POA: Diagnosis not present

## 2018-02-06 ENCOUNTER — Other Ambulatory Visit: Payer: Self-pay | Admitting: Internal Medicine

## 2018-02-06 DIAGNOSIS — Z780 Asymptomatic menopausal state: Secondary | ICD-10-CM

## 2018-03-01 ENCOUNTER — Ambulatory Visit: Payer: Federal, State, Local not specified - PPO | Admitting: Family Medicine

## 2018-03-02 ENCOUNTER — Other Ambulatory Visit (HOSPITAL_COMMUNITY): Payer: Federal, State, Local not specified - PPO

## 2018-03-08 DIAGNOSIS — G43109 Migraine with aura, not intractable, without status migrainosus: Secondary | ICD-10-CM | POA: Diagnosis not present

## 2018-03-13 DIAGNOSIS — E782 Mixed hyperlipidemia: Secondary | ICD-10-CM | POA: Diagnosis not present

## 2018-03-13 DIAGNOSIS — Z6829 Body mass index (BMI) 29.0-29.9, adult: Secondary | ICD-10-CM | POA: Diagnosis not present

## 2018-03-13 DIAGNOSIS — G43009 Migraine without aura, not intractable, without status migrainosus: Secondary | ICD-10-CM | POA: Diagnosis not present

## 2018-03-13 DIAGNOSIS — Z0000 Encounter for general adult medical examination without abnormal findings: Secondary | ICD-10-CM | POA: Diagnosis not present

## 2018-03-19 ENCOUNTER — Ambulatory Visit (HOSPITAL_COMMUNITY)
Admission: RE | Admit: 2018-03-19 | Discharge: 2018-03-19 | Disposition: A | Discharge status: Home / Self Care | Payer: Federal, State, Local not specified - PPO | Source: Ambulatory Visit | Attending: Internal Medicine | Admitting: Internal Medicine

## 2018-03-19 DIAGNOSIS — Z780 Asymptomatic menopausal state: Secondary | ICD-10-CM | POA: Diagnosis not present

## 2018-03-19 DIAGNOSIS — M85851 Other specified disorders of bone density and structure, right thigh: Secondary | ICD-10-CM | POA: Diagnosis not present

## 2018-05-04 ENCOUNTER — Other Ambulatory Visit (HOSPITAL_COMMUNITY): Payer: Self-pay | Admitting: Internal Medicine

## 2018-05-04 DIAGNOSIS — Z1231 Encounter for screening mammogram for malignant neoplasm of breast: Secondary | ICD-10-CM

## 2018-05-18 ENCOUNTER — Ambulatory Visit (HOSPITAL_COMMUNITY)
Admission: RE | Admit: 2018-05-18 | Discharge: 2018-05-18 | Disposition: A | Discharge status: Home / Self Care | Payer: Federal, State, Local not specified - PPO | Source: Ambulatory Visit | Attending: Internal Medicine | Admitting: Internal Medicine

## 2018-05-18 DIAGNOSIS — Z1231 Encounter for screening mammogram for malignant neoplasm of breast: Secondary | ICD-10-CM

## 2018-05-21 ENCOUNTER — Other Ambulatory Visit (HOSPITAL_COMMUNITY): Payer: Self-pay | Admitting: Internal Medicine

## 2018-05-21 DIAGNOSIS — R928 Other abnormal and inconclusive findings on diagnostic imaging of breast: Secondary | ICD-10-CM

## 2018-06-12 ENCOUNTER — Ambulatory Visit (HOSPITAL_COMMUNITY)
Admission: RE | Admit: 2018-06-12 | Discharge: 2018-06-12 | Disposition: A | Discharge status: Home / Self Care | Payer: Federal, State, Local not specified - PPO | Source: Ambulatory Visit | Attending: Internal Medicine | Admitting: Internal Medicine

## 2018-06-12 ENCOUNTER — Encounter (HOSPITAL_COMMUNITY): Payer: Self-pay

## 2018-06-12 DIAGNOSIS — R928 Other abnormal and inconclusive findings on diagnostic imaging of breast: Secondary | ICD-10-CM | POA: Insufficient documentation

## 2018-06-12 DIAGNOSIS — N6489 Other specified disorders of breast: Secondary | ICD-10-CM | POA: Diagnosis not present

## 2018-06-12 DIAGNOSIS — R922 Inconclusive mammogram: Secondary | ICD-10-CM | POA: Diagnosis not present

## 2018-06-15 ENCOUNTER — Other Ambulatory Visit: Payer: Self-pay | Admitting: Internal Medicine

## 2018-06-15 DIAGNOSIS — R928 Other abnormal and inconclusive findings on diagnostic imaging of breast: Secondary | ICD-10-CM

## 2018-06-21 ENCOUNTER — Ambulatory Visit
Admission: RE | Admit: 2018-06-21 | Discharge: 2018-06-21 | Disposition: A | Discharge status: Home / Self Care | Payer: Federal, State, Local not specified - PPO | Source: Ambulatory Visit | Attending: Internal Medicine | Admitting: Internal Medicine

## 2018-06-21 DIAGNOSIS — R928 Other abnormal and inconclusive findings on diagnostic imaging of breast: Secondary | ICD-10-CM

## 2018-06-21 DIAGNOSIS — N6321 Unspecified lump in the left breast, upper outer quadrant: Secondary | ICD-10-CM | POA: Diagnosis not present

## 2018-06-29 DIAGNOSIS — I87323 Chronic venous hypertension (idiopathic) with inflammation of bilateral lower extremity: Secondary | ICD-10-CM | POA: Diagnosis not present

## 2018-07-09 ENCOUNTER — Encounter: Payer: Self-pay | Admitting: General Surgery

## 2018-07-09 ENCOUNTER — Other Ambulatory Visit: Payer: Self-pay | Admitting: General Surgery

## 2018-07-09 DIAGNOSIS — I341 Nonrheumatic mitral (valve) prolapse: Secondary | ICD-10-CM | POA: Diagnosis not present

## 2018-07-09 DIAGNOSIS — R928 Other abnormal and inconclusive findings on diagnostic imaging of breast: Secondary | ICD-10-CM | POA: Diagnosis not present

## 2018-07-09 DIAGNOSIS — H409 Unspecified glaucoma: Secondary | ICD-10-CM | POA: Diagnosis not present

## 2018-07-09 DIAGNOSIS — Z803 Family history of malignant neoplasm of breast: Secondary | ICD-10-CM | POA: Diagnosis not present

## 2018-07-10 ENCOUNTER — Other Ambulatory Visit: Payer: Self-pay | Admitting: General Surgery

## 2018-07-10 DIAGNOSIS — R928 Other abnormal and inconclusive findings on diagnostic imaging of breast: Secondary | ICD-10-CM

## 2018-07-11 DIAGNOSIS — H524 Presbyopia: Secondary | ICD-10-CM | POA: Diagnosis not present

## 2018-07-11 DIAGNOSIS — Z8669 Personal history of other diseases of the nervous system and sense organs: Secondary | ICD-10-CM | POA: Diagnosis not present

## 2018-07-11 DIAGNOSIS — H2513 Age-related nuclear cataract, bilateral: Secondary | ICD-10-CM | POA: Diagnosis not present

## 2018-07-11 DIAGNOSIS — H5203 Hypermetropia, bilateral: Secondary | ICD-10-CM | POA: Diagnosis not present

## 2018-07-12 ENCOUNTER — Encounter (HOSPITAL_BASED_OUTPATIENT_CLINIC_OR_DEPARTMENT_OTHER): Payer: Self-pay | Admitting: *Deleted

## 2018-07-12 ENCOUNTER — Other Ambulatory Visit: Payer: Self-pay

## 2018-07-15 NOTE — H&P (Signed)
Richardean Chimeraeborah C Brunkhorst [facility] Patient #: [id number] DOB: 11/03/1955 Married / Language: English / Race: White Female        History of Present Illness       . This is a 63 year old female from [place]. Her husband is with her throughout the encounter. Acey LavJacky was my chaperone. She is referred by Dr. Earlene Plateravis at [facility] for surgical opinion regarding management of complex sclerosing lesion left breast laterally. Zack Margo AyeHall and Nita SellsJohn Hall are involved in primary care.       She had a small excisional biopsy of her right breast many years ago for fibrocystic change. Otherwise no breast problems. Recent screening mammogram show a focal area of distortion in the lateral left breast. Axillary ultrasound negative. This was not seen on ultrasound. Stereotactic biopsy revealed complex gross lesion. No atypia.      Past history significant for right breast biopsy for fibrocystic change 2004. Venous stasis disease. Glaucoma. Mitral valve prolapse. Colectomy 2004 for polyps. Right ovary removed for dermoid. Ultimately had the other ovary and the uterus removed. Esophageal stricture. Family history reveals sister was diagnosed with breast cancer age 63. A survivor. No other family history of breast, ovarian, prostate, or pancreatic cancer. Mother living and well. Father died of squamous cell carcinoma possibly esophagitis Social history reveals she is married. Husband present today. 2 children. Lives in [place]. Denies tobacco or alcohol. 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 I told her the current statistics predict association with early cancer in 4-9%. Most likely she does not have cancer. Current standard is to excise these areas. Given her family history of breast cancer her sister this is what she wants to do. She'll be scheduled for left breast lumpectomy with radioactive seed localization in the  near future. I discussed the indications, details, techniques, numerous risk of the surgery with her and her husband. She is aware of the risk of bleeding, infection, cosmetic deformity, chronic pain, reoperation of cancer. She understands all these issues. All her questions were answered. She agrees with this plan.   Past Surgical History  Breast Biopsy  Left. Colon Removal - Partial  Gallbladder Surgery - Laparoscopic  Hysterectomy (not due to cancer) - Complete  Oral Surgery  Ventral / Umbilical Hernia Surgery  Right.  Diagnostic Studies History  Colonoscopy  1-5 years ago Mammogram  within last year Pap Smear  >5 years ago  Allergies  No Known Drug Allergies Allergies Reconciled   Medication History  SUMAtriptan Succinate (100MG  Tablet, Oral) Active. Vitamin D (1000UNIT Tablet, Oral) Active. Vitamin C (250MG  Tablet, Oral) Active. Calcium (Oral) Specific strength unknown - Active. Vitamin B2 (50MG  Tablet, Oral) Active. Medications Reconciled  Social History  Caffeine use  Tea. No alcohol use  No drug use  Tobacco use  Never smoker.  Family History  Arthritis  Mother. Breast Cancer  Sister. Cancer  Father. Diabetes Mellitus  Daughter, Family Members In General, Father. Heart Disease  Father. Heart disease in female family member before age 63  Hypertension  Father. Migraine Headache  Family Members In General.  Pregnancy / Birth History  Age at menarche  12 years. Age of menopause  7746-50 Contraceptive History  Oral contraceptives. Gravida  3 Length (months) of breastfeeding  3-6 Maternal age  63-20 Para  2  Other Problems  Gastroesophageal Reflux Disease  Hemorrhoids  Migraine Headache  Oophorectomy  Bilateral. Umbilical Hernia Repair  Review of Systems General Not Present- Appetite Loss, Chills, Fatigue, Fever, Night Sweats, Weight Gain and Weight Loss. Skin Not Present- Change in Wart/Mole, Dryness,  Hives, Jaundice, New Lesions, Non-Healing Wounds, Rash and Ulcer. HEENT Present- Wears glasses/contact lenses. Not Present- Earache, Hearing Loss, Hoarseness, Nose Bleed, Oral Ulcers, Ringing in the Ears, Seasonal Allergies, Sinus Pain, Sore Throat, Visual Disturbances and Yellow Eyes. Respiratory Not Present- Bloody sputum, Chronic Cough, Difficulty Breathing, Snoring and Wheezing. Breast Not Present- Breast Mass, Breast Pain, Nipple Discharge and Skin Changes. Cardiovascular Not Present- Chest Pain, Difficulty Breathing Lying Down, Leg Cramps, Palpitations, Rapid Heart Rate, Shortness of Breath and Swelling of Extremities. Gastrointestinal Present- Hemorrhoids. Not Present- Abdominal Pain, Bloating, Bloody Stool, Change in Bowel Habits, Chronic diarrhea, Constipation, Difficulty Swallowing, Excessive gas, Gets full quickly at meals, Indigestion, Nausea, Rectal Pain and Vomiting. Female Genitourinary Present- Nocturia. Not Present- Frequency, Painful Urination, Pelvic Pain and Urgency. Musculoskeletal Present- Joint Stiffness and Muscle Weakness. Not Present- Back Pain, Joint Pain, Muscle Pain and Swelling of Extremities. Neurological Present- Headaches. Not Present- Decreased Memory, Fainting, Numbness, Seizures, Tingling, Tremor, Trouble walking and Weakness. Psychiatric Not Present- Anxiety, Bipolar, Change in Sleep Pattern, Depression, Fearful and Frequent crying. Endocrine Not Present- Cold Intolerance, Excessive Hunger, Hair Changes, Heat Intolerance, Hot flashes and New Diabetes. Hematology Present- Easy Bruising. Not Present- Blood Thinners, Excessive bleeding, Gland problems, HIV and Persistent Infections.  Vitals  Weight: 179.6 lb Height: 66.5in Body Surface Area: 1.92 m Body Mass Index: 28.55 kg/m  Temp.: 98.30F  Pulse: 93 (Regular)  BP: 134/82 (Sitting, Left Arm, Standard)    Physical Exam  General Mental Status-Alert. General Appearance-Consistent with stated  age. Hydration-Well hydrated. Voice-Normal.  Head and Neck Head-normocephalic, atraumatic with no lesions or palpable masses. Trachea-midline. Thyroid Gland Characteristics - normal size and consistency.  Eye Eyeball - Bilateral-Extraocular movements intact. Sclera/Conjunctiva - Bilateral-No scleral icterus.  Chest and Lung Exam Chest and lung exam reveals -quiet, even and easy respiratory effort with no use of accessory muscles and on auscultation, normal breath sounds, no adventitious sounds and normal vocal resonance. Inspection Chest Wall - Normal. Back - normal.  Breast Note: Breasts are medium size. In the right breast there is a short healed incision lower inner quadrant just above the inframammary crease. Left breast reveals biopsy site laterally but no hematoma. His no mass in either breast. No other skin changes. No axillary adenopathy.   Cardiovascular Cardiovascular examination reveals -normal heart sounds, regular rate and rhythm with no murmurs and normal pedal pulses bilaterally.  Abdomen Inspection Inspection of the abdomen reveals - No Hernias. Palpation/Percussion Palpation and Percussion of the abdomen reveal - Soft, Non Tender, No Rebound tenderness, No Rigidity (guarding) and No hepatosplenomegaly. Auscultation Auscultation of the abdomen reveals - Bowel sounds normal.  Neurologic Neurologic evaluation reveals -alert and oriented x 3 with no impairment of recent or remote memory. Mental Status-Normal.  Musculoskeletal Normal Exam - Left-Upper Extremity Strength Normal and Lower Extremity Strength Normal. Normal Exam - Right-Upper Extremity Strength Normal and Lower Extremity Strength Normal.  Lymphatic Head & Neck  General Head & Neck Lymphatics: Bilateral - Description - Normal. Axillary  General Axillary Region: Bilateral - Description - Normal. Tenderness - Non Tender. Femoral & Inguinal  Generalized Femoral &  Inguinal Lymphatics: Bilateral - Description - Normal. Tenderness - Non Tender.    Assessment & Plan  ABNORMALITY OF LEFT BREAST ON SCREENING MAMMOGRAM (R92.8)   Your recent mammograms and image guided biopsy showing a small focal area of distortion in  the left breast laterally Ultrasound of the lymph nodes under arm are normal The biopsy shows a condition called complex closing lesion This is associated with cancer in 4-9% of cases by current statistical analysis Your sister was diagnosed with breast cancer at approximately age 63 and is a survivor  Currently the standard is to offer to excise these areas The only alternative is close active observation with 6 months follow-up you stated you would like to go ahead and have this area excised and that is appropriate you'll be scheduled for left breast lumpectomy with radioactive seed localization I have discussed the indications, techniques, and risk of the surgery with you and your husband  MITRAL VALVE PROLAPSE (I34.1) GLAUCOMA (H40.9) FAMILY HISTORY OF BREAST CANCER IN SISTER (Z80.3)    Angelia MouldHaywood M. Derrell LollingIngram, M.D., [facility] General and Minimally invasive Surgery Breast and Colorectal Surgery Office:   [phone] Pager:   [phone]

## 2018-07-19 ENCOUNTER — Ambulatory Visit
Admission: RE | Admit: 2018-07-19 | Discharge: 2018-07-19 | Disposition: A | Discharge status: Home / Self Care | Payer: Federal, State, Local not specified - PPO | Source: Ambulatory Visit | Attending: General Surgery | Admitting: General Surgery

## 2018-07-19 DIAGNOSIS — R928 Other abnormal and inconclusive findings on diagnostic imaging of breast: Secondary | ICD-10-CM

## 2018-07-19 DIAGNOSIS — N6320 Unspecified lump in the left breast, unspecified quadrant: Secondary | ICD-10-CM | POA: Diagnosis not present

## 2018-07-19 NOTE — Progress Notes (Signed)
Ensure pre surgery drink given with instructions to complete by 0800 dos, pt verbalized understanding. 

## 2018-07-20 ENCOUNTER — Encounter (HOSPITAL_BASED_OUTPATIENT_CLINIC_OR_DEPARTMENT_OTHER)
Admission: RE | Disposition: A | Discharge status: Home / Self Care | Payer: Self-pay | Source: Ambulatory Visit | Attending: General Surgery

## 2018-07-20 ENCOUNTER — Ambulatory Visit (HOSPITAL_BASED_OUTPATIENT_CLINIC_OR_DEPARTMENT_OTHER)
Admission: RE | Admit: 2018-07-20 | Discharge: 2018-07-20 | Disposition: A | Discharge status: Home / Self Care | Payer: Federal, State, Local not specified - PPO | Source: Ambulatory Visit | Attending: General Surgery | Admitting: General Surgery

## 2018-07-20 ENCOUNTER — Encounter (HOSPITAL_BASED_OUTPATIENT_CLINIC_OR_DEPARTMENT_OTHER): Payer: Self-pay | Admitting: Emergency Medicine

## 2018-07-20 ENCOUNTER — Ambulatory Visit (HOSPITAL_BASED_OUTPATIENT_CLINIC_OR_DEPARTMENT_OTHER): Payer: Federal, State, Local not specified - PPO | Admitting: Anesthesiology

## 2018-07-20 ENCOUNTER — Other Ambulatory Visit: Payer: Self-pay

## 2018-07-20 ENCOUNTER — Ambulatory Visit
Admission: RE | Admit: 2018-07-20 | Discharge: 2018-07-20 | Disposition: A | Discharge status: Home / Self Care | Payer: Federal, State, Local not specified - PPO | Source: Ambulatory Visit | Attending: General Surgery | Admitting: General Surgery

## 2018-07-20 DIAGNOSIS — N62 Hypertrophy of breast: Secondary | ICD-10-CM | POA: Diagnosis not present

## 2018-07-20 DIAGNOSIS — N6320 Unspecified lump in the left breast, unspecified quadrant: Secondary | ICD-10-CM | POA: Diagnosis not present

## 2018-07-20 DIAGNOSIS — N6489 Other specified disorders of breast: Secondary | ICD-10-CM | POA: Insufficient documentation

## 2018-07-20 DIAGNOSIS — R928 Other abnormal and inconclusive findings on diagnostic imaging of breast: Secondary | ICD-10-CM | POA: Diagnosis not present

## 2018-07-20 DIAGNOSIS — Z79899 Other long term (current) drug therapy: Secondary | ICD-10-CM | POA: Insufficient documentation

## 2018-07-20 DIAGNOSIS — K219 Gastro-esophageal reflux disease without esophagitis: Secondary | ICD-10-CM | POA: Insufficient documentation

## 2018-07-20 DIAGNOSIS — N6012 Diffuse cystic mastopathy of left breast: Secondary | ICD-10-CM | POA: Diagnosis not present

## 2018-07-20 DIAGNOSIS — C50912 Malignant neoplasm of unspecified site of left female breast: Secondary | ICD-10-CM | POA: Diagnosis not present

## 2018-07-20 DIAGNOSIS — N6082 Other benign mammary dysplasias of left breast: Secondary | ICD-10-CM | POA: Diagnosis not present

## 2018-07-20 DIAGNOSIS — Z803 Family history of malignant neoplasm of breast: Secondary | ICD-10-CM | POA: Diagnosis not present

## 2018-07-20 DIAGNOSIS — G43909 Migraine, unspecified, not intractable, without status migrainosus: Secondary | ICD-10-CM | POA: Diagnosis not present

## 2018-07-20 DIAGNOSIS — Z9049 Acquired absence of other specified parts of digestive tract: Secondary | ICD-10-CM | POA: Insufficient documentation

## 2018-07-20 DIAGNOSIS — R921 Mammographic calcification found on diagnostic imaging of breast: Secondary | ICD-10-CM | POA: Diagnosis not present

## 2018-07-20 HISTORY — DX: Other abnormal and inconclusive findings on diagnostic imaging of breast: R92.8

## 2018-07-20 HISTORY — PX: BREAST LUMPECTOMY WITH RADIOACTIVE SEED LOCALIZATION: SHX6424

## 2018-07-20 SURGERY — BREAST LUMPECTOMY WITH RADIOACTIVE SEED LOCALIZATION
Anesthesia: General | Site: Breast | Laterality: Left

## 2018-07-20 MED ORDER — DEXAMETHASONE SODIUM PHOSPHATE 10 MG/ML IJ SOLN
INTRAMUSCULAR | Status: AC
  Filled 2018-07-20: qty 1

## 2018-07-20 MED ORDER — GABAPENTIN 300 MG PO CAPS
ORAL_CAPSULE | ORAL | Status: AC
  Filled 2018-07-20: qty 1

## 2018-07-20 MED ORDER — HYDROMORPHONE HCL 1 MG/ML IJ SOLN
INTRAMUSCULAR | Status: AC
  Filled 2018-07-20: qty 0.5

## 2018-07-20 MED ORDER — HYDROMORPHONE HCL 1 MG/ML IJ SOLN
0.2500 mg | INTRAMUSCULAR | Status: DC | PRN
  Administered 2018-07-20: 0.5 mg via INTRAVENOUS

## 2018-07-20 MED ORDER — GABAPENTIN 300 MG PO CAPS
300.0000 mg | ORAL_CAPSULE | ORAL | Status: AC
  Administered 2018-07-20: 300 mg via ORAL

## 2018-07-20 MED ORDER — ACETAMINOPHEN 500 MG PO TABS: 1000.0000 mg | ORAL_TABLET | Freq: Four times a day (QID) | ORAL | Status: DC

## 2018-07-20 MED ORDER — ONDANSETRON HCL 4 MG/2ML IJ SOLN
INTRAMUSCULAR | Status: DC | PRN
  Administered 2018-07-20: 4 mg via INTRAVENOUS

## 2018-07-20 MED ORDER — CELECOXIB 200 MG PO CAPS
200.0000 mg | ORAL_CAPSULE | ORAL | Status: AC
  Administered 2018-07-20: 200 mg via ORAL

## 2018-07-20 MED ORDER — CHLORHEXIDINE GLUCONATE CLOTH 2 % EX PADS: 6.0000 | MEDICATED_PAD | Freq: Once | CUTANEOUS | Status: DC

## 2018-07-20 MED ORDER — SODIUM CHLORIDE 0.9 % IV SOLN: 250.0000 mL | INTRAVENOUS | Status: DC | PRN

## 2018-07-20 MED ORDER — BUPIVACAINE HCL (PF) 0.5 % IJ SOLN
INTRAMUSCULAR | Status: DC | PRN
  Administered 2018-07-20: 10 mL

## 2018-07-20 MED ORDER — ONDANSETRON HCL 4 MG/2ML IJ SOLN
INTRAMUSCULAR | Status: AC
  Filled 2018-07-20: qty 2

## 2018-07-20 MED ORDER — MIDAZOLAM HCL 2 MG/2ML IJ SOLN
INTRAMUSCULAR | Status: AC
  Filled 2018-07-20: qty 2

## 2018-07-20 MED ORDER — ACETAMINOPHEN 650 MG RE SUPP: 650.0000 mg | RECTAL | Status: DC | PRN

## 2018-07-20 MED ORDER — SODIUM CHLORIDE 0.9% FLUSH: 3.0000 mL | INTRAVENOUS | Status: DC | PRN

## 2018-07-20 MED ORDER — MIDAZOLAM HCL 2 MG/2ML IJ SOLN
1.0000 mg | INTRAMUSCULAR | Status: DC | PRN
  Administered 2018-07-20: 2 mg via INTRAVENOUS

## 2018-07-20 MED ORDER — PROPOFOL 10 MG/ML IV BOLUS
INTRAVENOUS | Status: DC | PRN
  Administered 2018-07-20: 150 mg via INTRAVENOUS

## 2018-07-20 MED ORDER — ACETAMINOPHEN 500 MG PO TABS
1000.0000 mg | ORAL_TABLET | ORAL | Status: AC
  Administered 2018-07-20: 1000 mg via ORAL

## 2018-07-20 MED ORDER — FENTANYL CITRATE (PF) 100 MCG/2ML IJ SOLN: 25.0000 ug | INTRAMUSCULAR | Status: DC | PRN

## 2018-07-20 MED ORDER — OXYCODONE HCL 5 MG PO TABS
ORAL_TABLET | ORAL | Status: AC
  Filled 2018-07-20: qty 1

## 2018-07-20 MED ORDER — ACETAMINOPHEN 325 MG PO TABS: 650.0000 mg | ORAL_TABLET | ORAL | Status: DC | PRN

## 2018-07-20 MED ORDER — ACETAMINOPHEN 500 MG PO TABS
ORAL_TABLET | ORAL | Status: AC
  Filled 2018-07-20: qty 2

## 2018-07-20 MED ORDER — HYDROCODONE-ACETAMINOPHEN 5-325 MG PO TABS: 1.0000 | ORAL_TABLET | Freq: Four times a day (QID) | ORAL | 0 refills | Status: DC | PRN

## 2018-07-20 MED ORDER — SCOPOLAMINE 1 MG/3DAYS TD PT72: 1.0000 | MEDICATED_PATCH | Freq: Once | TRANSDERMAL | Status: DC | PRN

## 2018-07-20 MED ORDER — CELECOXIB 200 MG PO CAPS
ORAL_CAPSULE | ORAL | Status: AC
  Filled 2018-07-20: qty 1

## 2018-07-20 MED ORDER — SODIUM CHLORIDE 0.9% FLUSH: 3.0000 mL | Freq: Two times a day (BID) | INTRAVENOUS | Status: DC

## 2018-07-20 MED ORDER — LIDOCAINE HCL (CARDIAC) PF 100 MG/5ML IV SOSY
PREFILLED_SYRINGE | INTRAVENOUS | Status: DC | PRN
  Administered 2018-07-20: 100 mg via INTRAVENOUS

## 2018-07-20 MED ORDER — MEPERIDINE HCL 25 MG/ML IJ SOLN: 6.2500 mg | INTRAMUSCULAR | Status: DC | PRN

## 2018-07-20 MED ORDER — DEXAMETHASONE SODIUM PHOSPHATE 4 MG/ML IJ SOLN
INTRAMUSCULAR | Status: DC | PRN
  Administered 2018-07-20: 10 mg via INTRAVENOUS

## 2018-07-20 MED ORDER — CEFAZOLIN SODIUM-DEXTROSE 2-4 GM/100ML-% IV SOLN
INTRAVENOUS | Status: AC
  Filled 2018-07-20: qty 100

## 2018-07-20 MED ORDER — FENTANYL CITRATE (PF) 100 MCG/2ML IJ SOLN
INTRAMUSCULAR | Status: AC
  Filled 2018-07-20: qty 2

## 2018-07-20 MED ORDER — OXYCODONE HCL 5 MG PO TABS
5.0000 mg | ORAL_TABLET | ORAL | Status: DC | PRN
  Administered 2018-07-20: 5 mg via ORAL

## 2018-07-20 MED ORDER — LACTATED RINGERS IV SOLN
INTRAVENOUS | Status: DC
  Administered 2018-07-20: 11:00:00 via INTRAVENOUS

## 2018-07-20 MED ORDER — LACTATED RINGERS IV SOLN: INTRAVENOUS | Status: DC

## 2018-07-20 MED ORDER — LIDOCAINE 2% (20 MG/ML) 5 ML SYRINGE
INTRAMUSCULAR | Status: AC
  Filled 2018-07-20: qty 5

## 2018-07-20 MED ORDER — ONDANSETRON HCL 4 MG/2ML IJ SOLN: 4.0000 mg | Freq: Once | INTRAMUSCULAR | Status: DC | PRN

## 2018-07-20 MED ORDER — FENTANYL CITRATE (PF) 100 MCG/2ML IJ SOLN
50.0000 ug | INTRAMUSCULAR | Status: DC | PRN
  Administered 2018-07-20 (×2): 50 ug via INTRAVENOUS

## 2018-07-20 MED ORDER — CEFAZOLIN SODIUM-DEXTROSE 2-4 GM/100ML-% IV SOLN
2.0000 g | INTRAVENOUS | Status: AC
  Administered 2018-07-20: 2 g via INTRAVENOUS

## 2018-07-20 SURGICAL SUPPLY — 60 items
APPLIER CLIP 9.375 MED OPEN (MISCELLANEOUS) ×2
BENZOIN TINCTURE PRP APPL 2/3 (GAUZE/BANDAGES/DRESSINGS) IMPLANT
BINDER BREAST LRG (GAUZE/BANDAGES/DRESSINGS) ×1 IMPLANT
BINDER BREAST MEDIUM (GAUZE/BANDAGES/DRESSINGS) IMPLANT
BINDER BREAST XLRG (GAUZE/BANDAGES/DRESSINGS) IMPLANT
BINDER BREAST XXLRG (GAUZE/BANDAGES/DRESSINGS) IMPLANT
BLADE HEX COATED 2.75 (ELECTRODE) ×2 IMPLANT
BLADE SURG 10 STRL SS (BLADE) IMPLANT
BLADE SURG 15 STRL LF DISP TIS (BLADE) ×1 IMPLANT
BLADE SURG 15 STRL SS (BLADE) ×1
CANISTER SUC SOCK COL 7IN (MISCELLANEOUS) IMPLANT
CANISTER SUCT 1200ML W/VALVE (MISCELLANEOUS) ×2 IMPLANT
CHLORAPREP W/TINT 26ML (MISCELLANEOUS) ×2 IMPLANT
CLIP APPLIE 9.375 MED OPEN (MISCELLANEOUS) IMPLANT
COVER BACK TABLE 60X90IN (DRAPES) ×2 IMPLANT
COVER MAYO STAND STRL (DRAPES) ×2 IMPLANT
COVER PROBE W GEL 5X96 (DRAPES) ×2 IMPLANT
COVER WAND RF STERILE (DRAPES) IMPLANT
DECANTER SPIKE VIAL GLASS SM (MISCELLANEOUS) IMPLANT
DERMABOND ADVANCED (GAUZE/BANDAGES/DRESSINGS) ×1
DERMABOND ADVANCED .7 DNX12 (GAUZE/BANDAGES/DRESSINGS) ×1 IMPLANT
DRAPE LAPAROSCOPIC ABDOMINAL (DRAPES) ×2 IMPLANT
DRAPE UTILITY XL STRL (DRAPES) ×2 IMPLANT
DRSG PAD ABDOMINAL 8X10 ST (GAUZE/BANDAGES/DRESSINGS) ×2 IMPLANT
ELECT REM PT RETURN 9FT ADLT (ELECTROSURGICAL) ×2
ELECTRODE REM PT RTRN 9FT ADLT (ELECTROSURGICAL) ×1 IMPLANT
GAUZE SPONGE 4X4 12PLY STRL LF (GAUZE/BANDAGES/DRESSINGS) ×2 IMPLANT
GLOVE BIO SURGEON STRL SZ7 (GLOVE) ×2 IMPLANT
GLOVE ECLIPSE 6.5 STRL STRAW (GLOVE) ×1 IMPLANT
GLOVE EUDERMIC 7 POWDERFREE (GLOVE) ×2 IMPLANT
GOWN STRL REUS W/ TWL LRG LVL3 (GOWN DISPOSABLE) ×1 IMPLANT
GOWN STRL REUS W/ TWL XL LVL3 (GOWN DISPOSABLE) ×1 IMPLANT
GOWN STRL REUS W/TWL LRG LVL3 (GOWN DISPOSABLE) ×1
GOWN STRL REUS W/TWL XL LVL3 (GOWN DISPOSABLE) ×1
ILLUMINATOR WAVEGUIDE N/F (MISCELLANEOUS) IMPLANT
KIT MARKER MARGIN INK (KITS) ×2 IMPLANT
LIGHT WAVEGUIDE WIDE FLAT (MISCELLANEOUS) IMPLANT
NDL HYPO 25X1 1.5 SAFETY (NEEDLE) ×1 IMPLANT
NEEDLE HYPO 25X1 1.5 SAFETY (NEEDLE) ×2 IMPLANT
NS IRRIG 1000ML POUR BTL (IV SOLUTION) ×2 IMPLANT
PACK BASIN DAY SURGERY FS (CUSTOM PROCEDURE TRAY) ×2 IMPLANT
PENCIL BUTTON HOLSTER BLD 10FT (ELECTRODE) ×2 IMPLANT
SHEET MEDIUM DRAPE 40X70 STRL (DRAPES) IMPLANT
SLEEVE SCD COMPRESS KNEE MED (MISCELLANEOUS) ×2 IMPLANT
SPONGE LAP 18X18 RF (DISPOSABLE) IMPLANT
SPONGE LAP 4X18 RFD (DISPOSABLE) ×3 IMPLANT
STRIP CLOSURE SKIN 1/2X4 (GAUZE/BANDAGES/DRESSINGS) IMPLANT
SUT ETHILON 3 0 FSL (SUTURE) IMPLANT
SUT MNCRL AB 4-0 PS2 18 (SUTURE) ×2 IMPLANT
SUT SILK 2 0 SH (SUTURE) ×2 IMPLANT
SUT VIC AB 2-0 CT1 27 (SUTURE)
SUT VIC AB 2-0 CT1 TAPERPNT 27 (SUTURE) IMPLANT
SUT VIC AB 3-0 SH 27 (SUTURE)
SUT VIC AB 3-0 SH 27X BRD (SUTURE) IMPLANT
SUT VICRYL 3-0 CR8 SH (SUTURE) ×2 IMPLANT
SYR 10ML LL (SYRINGE) ×2 IMPLANT
TOWEL GREEN STERILE FF (TOWEL DISPOSABLE) ×2 IMPLANT
TRAY FAXITRON CT DISP (TRAY / TRAY PROCEDURE) ×2 IMPLANT
TUBE CONNECTING 20X1/4 (TUBING) ×2 IMPLANT
YANKAUER SUCT BULB TIP NO VENT (SUCTIONS) ×2 IMPLANT

## 2018-07-20 NOTE — Discharge Instructions (Signed)
[facility] °Office Phone Number [phone] ° °BREAST BIOPSY/ PARTIAL MASTECTOMY: POST OP INSTRUCTIONS ° °Always review your discharge instruction sheet given to you by the facility where your surgery was performed. ° °IF YOU HAVE DISABILITY OR FAMILY LEAVE FORMS, YOU MUST BRING THEM TO THE OFFICE FOR PROCESSING.  DO NOT GIVE THEM TO YOUR DOCTOR. ° °1. A prescription for pain medication may be given to you upon discharge.  Take your pain medication as prescribed, if needed.  If narcotic pain medicine is not needed, then you may take acetaminophen (Tylenol) or ibuprofen (Advil) as needed. °2. Take your usually prescribed medications unless otherwise directed °3. If you need a refill on your pain medication, please contact your pharmacy.  They will contact our office to request authorization.  Prescriptions will not be filled after 5pm or on week-ends. °4. You should eat very light the first 24 hours after surgery, such as soup, crackers, pudding, etc.  Resume your normal diet the day after surgery. °5. Most patients will experience some swelling and bruising in the breast.  Ice packs and a good support bra will help.  Swelling and bruising can take several days to resolve.  °6. It is common to experience some constipation if taking pain medication after surgery.  Increasing fluid intake and taking a stool softener will usually help or prevent this problem from occurring.  A mild laxative (Milk of Magnesia or Miralax) should be taken according to package directions if there are no bowel movements after 48 hours. °7. Unless discharge instructions indicate otherwise, you may remove your bandages 24-48 hours after surgery, and you may shower at that time.  You may have steri-strips (small skin tapes) in place directly over the incision.  These strips should be left on the skin for 7-10 days.  If your surgeon used skin glue on the incision, you may shower in 24 hours.  The glue will flake off over the  next 2-3 weeks.  Any sutures or staples will be removed at the office during your follow-up visit. °8. ACTIVITIES:  You may resume regular daily activities (gradually increasing) beginning the next day.  Wearing a good support bra or sports bra minimizes pain and swelling.  You may have sexual intercourse when it is comfortable. °a. You may drive when you no longer are taking prescription pain medication, you can comfortably wear a seatbelt, and you can safely maneuver your car and apply brakes. °b. RETURN TO WORK:  ______________________________________________________________________________________ °9. You should see your doctor in the office for a follow-up appointment approximately two weeks after your surgery.  Your doctor’s nurse will typically make your follow-up appointment when she calls you with your pathology report.  Expect your pathology report 2-3 business days after your surgery.  You may call to check if you do not hear from us after three days. °10. OTHER INSTRUCTIONS: _______________________________________________________________________________________________ _____________________________________________________________________________________________________________________________________ °_____________________________________________________________________________________________________________________________________ °_____________________________________________________________________________________________________________________________________ ° °WHEN TO CALL YOUR DOCTOR: °1. Fever over 101.0 °2. Nausea and/or vomiting. °3. Extreme swelling or bruising. °4. Continued bleeding from incision. °5. Increased pain, redness, or drainage from the incision. ° °The clinic staff is available to answer your questions during regular business hours.  Please don’t hesitate to call and ask to speak to one of the nurses for clinical concerns.  If you have a medical emergency, go to the nearest  emergency room or call 911.  A surgeon from [facility] is always on call at the hospital. ° °For further questions, please visit [email or web address]  ° ° ° ° ° ° ° ° °••••••••• ° ° °  Managing Your Pain After Surgery Without Opioids ° ° ° °Thank you for participating in our program to help patients manage their pain after surgery without opioids. This is part of our effort to provide you with the best care possible, without exposing you or your family to the risk that opioids pose. ° °What pain can I expect after surgery? °You can expect to have some pain after surgery. This is normal. The pain is typically worse the day after surgery, and quickly begins to get better. °Many studies have found that many patients are able to manage their pain after surgery with Over-the-Counter (OTC) medications such as Tylenol and Motrin. If you have a condition that does not allow you to take Tylenol or Motrin, notify your surgical team. ° °How will I manage my pain? °The best strategy for controlling your pain after surgery is around the clock pain control with Tylenol (acetaminophen) and Motrin (ibuprofen or Advil). Alternating these medications with each other allows you to maximize your pain control. In addition to Tylenol and Motrin, you can use heating pads or ice packs on your incisions to help reduce your pain. ° °How will I alternate your regular strength over-the-counter pain medication? °You will take a dose of pain medication every three hours. °; Start by taking 650 mg of Tylenol (2 pills of 325 mg) °; 3 hours later take 600 mg of Motrin (3 pills of 200 mg) °; 3 hours after taking the Motrin take 650 mg of Tylenol °; 3 hours after that take 600 mg of Motrin. ° ° °- 1 - ° °See example - if your first dose of Tylenol is at 12:00 PM ° ° °12:00 PM Tylenol 650 mg (2 pills of 325 mg)  °3:00 PM Motrin 600 mg (3 pills of 200 mg)  °6:00 PM Tylenol 650 mg (2 pills of 325 mg)  °9:00 PM Motrin 600 mg (3 pills of  200 mg)  °Continue alternating every 3 hours  ° °We recommend that you follow this schedule around-the-clock for at least 3 days after surgery, or until you feel that it is no longer needed. Use the table on the last page of this handout to keep track of the medications you are taking. °Important: °Do not take more than 3000mg of Tylenol or 3200mg of Motrin in a 24-hour period. °Do not take ibuprofen/Motrin if you have a history of bleeding stomach ulcers, severe kidney disease, &/or actively taking a blood thinner ° °What if I still have pain? °If you have pain that is not controlled with the over-the-counter pain medications (Tylenol and Motrin or Advil) you might have what we call “breakthrough” pain. You will receive a prescription for a small amount of an opioid pain medication such as Oxycodone, Tramadol, or Tylenol with Codeine. Use these opioid pills in the first 24 hours after surgery if you have breakthrough pain. Do not take more than 1 pill every 4-6 hours. ° °If you still have uncontrolled pain after using all opioid pills, don't hesitate to call our staff using the number provided. We will help make sure you are managing your pain in the best way possible, and if necessary, we can provide a prescription for additional pain medication. ° ° °Day 1   ° °Time  °Name of Medication Number of pills taken  °Amount of Acetaminophen  °Pain Level  ° °Comments  °AM PM       °AM PM       °AM PM       °  AM PM       °AM PM       °AM PM       °AM PM       °AM PM       °Total Daily amount of Acetaminophen °Do not take more than  3,000 mg per day    ° ° °Day 2   ° °Time  °Name of Medication Number of pills °taken  °Amount of Acetaminophen  °Pain Level  ° °Comments  °AM PM       °AM PM       °AM PM       °AM PM       °AM PM       °AM PM       °AM PM       °AM PM       °Total Daily amount of Acetaminophen °Do not take more than  3,000 mg per day    ° ° °Day 3   ° °Time  °Name of Medication Number of pills taken  °Amount of  Acetaminophen  °Pain Level  ° °Comments  °AM PM       °AM PM       °AM PM       °AM PM       ° ° ° °AM PM       °AM PM       °AM PM       °AM PM       °Total Daily amount of Acetaminophen °Do not take more than  3,000 mg per day    ° ° °Day 4   ° °Time  °Name of Medication Number of pills taken  °Amount of Acetaminophen  °Pain Level  ° °Comments  °AM PM       °AM PM       °AM PM       °AM PM       °AM PM       °AM PM       °AM PM       °AM PM       °Total Daily amount of Acetaminophen °Do not take more than  3,000 mg per day    ° ° °Day 5   ° °Time  °Name of Medication Number °of pills taken  °Amount of Acetaminophen  °Pain Level  ° °Comments  °AM PM       °AM PM       °AM PM       °AM PM       °AM PM       °AM PM       °AM PM       °AM PM       °Total Daily amount of Acetaminophen °Do not take more than  3,000 mg per day    ° ° ° °Day 6   ° °Time  °Name of Medication Number of pills °taken  °Amount of Acetaminophen  °Pain Level  °Comments  °AM PM       °AM PM       °AM PM       °AM PM       °AM PM       °AM PM       °AM PM       °AM PM       °Total Daily amount of Acetaminophen °Do not take more than    3,000 mg per day    ° ° °Day 7   ° °Time  °Name of Medication Number of pills taken  °Amount of Acetaminophen  °Pain Level  ° °Comments  °AM PM       °AM PM       °AM PM       °AM PM       °AM PM       °AM PM       °AM PM       °AM PM       °Total Daily amount of Acetaminophen °Do not take more than  3,000 mg per day    ° ° ° ° °For additional information about how and where to safely dispose of unused opioid °medications - https://www.morepowerfulnc.org ° °Disclaimer: This document contains information and/or instructional materials adapted from [facility] for the typical patient with your condition. It does not replace medical advice from your health care provider because your experience may differ from that of the °typical patient. Talk to your health care provider if you have any questions about  this °document, your condition or your treatment plan. °Adapted from [facility] ° ° ° ° ° °Post Anesthesia Home Care Instructions ° °Activity: °Get plenty of rest for the remainder of the day. A responsible individual must stay with you for 24 hours following the procedure.  °For the next 24 hours, DO NOT: °-Drive a car °-Operate machinery °-Drink alcoholic beverages °-Take any medication unless instructed by your physician °-Make any legal decisions or sign important papers. ° °Meals: °Start with liquid foods such as gelatin or soup. Progress to regular foods as tolerated. Avoid greasy, spicy, heavy foods. If nausea and/or vomiting occur, drink only clear liquids until the nausea and/or vomiting subsides. Call your physician if vomiting continues. ° °Special Instructions/Symptoms: °Your throat may feel dry or sore from the anesthesia or the breathing tube placed in your throat during surgery. If this causes discomfort, gargle with warm salt water. The discomfort should disappear within 24 hours. ° °If you had a scopolamine patch placed behind your ear for the management of post- operative nausea and/or vomiting: ° °1. The medication in the patch is effective for 72 hours, after which it should be removed.  Wrap patch in a tissue and discard in the trash. Wash hands thoroughly with soap and water. °2. You may remove the patch earlier than 72 hours if you experience unpleasant side effects which may include dry mouth, dizziness or visual disturbances. °3. Avoid touching the patch. Wash your hands with soap and water after contact with the patch. °   ° °

## 2018-07-20 NOTE — Anesthesia Postprocedure Evaluation (Signed)
Anesthesia Post Note  Patient: Melinda MantleDeborah Chambers English  Procedure(s) Performed: LEFT BREAST LUMPECTOMY WITH RADIOACTIVE SEED LOCALIZATION (Left Breast)     Patient location during evaluation: PACU Anesthesia Type: General Level of consciousness: awake and alert Pain management: pain level controlled Vital Signs Assessment: post-procedure vital signs reviewed and stable Respiratory status: spontaneous breathing, nonlabored ventilation, respiratory function stable and patient connected to nasal cannula oxygen Cardiovascular status: blood pressure returned to baseline and stable Postop Assessment: no apparent nausea or vomiting Anesthetic complications: no    Last Vitals:  Vitals:   07/20/18 1321 07/20/18 1415  BP: 134/74 115/90  Pulse: 60 74  Resp: 12 16  Temp:  36.6 C  SpO2: 100% 98%    Last Pain:  Vitals:   07/20/18 1415  TempSrc:   PainSc: 2                  Sarahlynn Cisnero DAVID

## 2018-07-20 NOTE — Transfer of Care (Signed)
Immediate Anesthesia Transfer of Care Note  Patient: Melinda English  Procedure(s) Performed: LEFT BREAST LUMPECTOMY WITH RADIOACTIVE SEED LOCALIZATION (Left Breast)  Patient Location: PACU  Anesthesia Type:General  Level of Consciousness: awake and sedated  Airway & Oxygen Therapy: Patient Spontanous Breathing and Patient connected to face mask oxygen  Post-op Assessment: Report given to RN and Post -op Vital signs reviewed and stable  Post vital signs: Reviewed and stable  Last Vitals:  Vitals Value Taken Time  BP    Temp    Pulse 73 07/20/2018 12:29 PM  Resp    SpO2 100 % 07/20/2018 12:29 PM  Vitals shown include unvalidated device data.  Last Pain:  Vitals:   07/20/18 1009  TempSrc: Oral  PainSc: 0-No pain         Complications: No apparent anesthesia complications

## 2018-07-20 NOTE — Op Note (Signed)
Patient Name:           Melinda English   Date of Surgery:        07/20/2018  Pre op Diagnosis:      Complex sclerosing lesion left breast, 3 o'clock position  Post op Diagnosis:    Same  Procedure:                 Left breast lumpectomy with radioactive seed localization  Surgeon:                     Edsel Petrin. Dalbert Batman, M.D., FACS  Assistant:                      OR staff  Operative Indications:   . This is a 63 year old female from [place]. She is referred by Dr. Rosana Hoes at the [facility] for surgical opinion regarding management of complex sclerosing lesion left breast laterally. Zack [place] Crane and Allyn Kenner are involved in primary care.       She had a small excisional biopsy of her right breast many years ago for fibrocystic change. Otherwise no breast problems. Recent screening mammogram show a focal area of distortion in the lateral left breast. Axillary ultrasound negative. This was not seen on ultrasound. Stereotactic biopsy revealed complex gross lesion. No atypia.      Past history significant for right breast biopsy for fibrocystic change 2004. Venous stasis disease. Glaucoma. Mitral valve prolapse. Colectomy 2004 for polyps. Right ovary removed for dermoid. Ultimately had the other ovary and the uterus removed. Esophageal stricture. Family history reveals sister was diagnosed with breast cancer age 74. A survivor. No other family history of breast, ovarian, prostate, or pancreatic cancer. Mother living and well. Father died of squamous cell carcinoma possibly esophagus.     We had a long discussion. I told her the current statistics predict association with early cancer in 4-9%. Most likely she does not have cancer. Current standard is to excise these areas. Given her family history of breast cancer her sister this is what she wants to do. She'll be scheduled for left breast lumpectomy with radioactive seed localization in the near future.n.  Operative  Findings:       The radioactive seed and the original biopsy marker clip were immediately adjacent to each other in the middle depth left breast 3 o'clock position.  The specimen mammogram looked very good.  3D Faxitron was used.  The seed and the clip appeared to be in the center of the specimen  Procedure in Detail:          Following the induction of general LMA anesthesia the patient's left breast was prepped and draped in a sterile fashion.  Intravenous antibiotics were given.  Surgical timeout was performed.  0.5% Marcaine was used as local infiltration anesthetic.  Using the neoprobe I found the radioactive seed.  I planned and made a curvilinear circumareolar incision in the lateral left breast about 2 cm lateral to the areolar margin.  The incision was made with a knife and the lumpectomy was performed with electrocautery and the neoprobe.  The specimen was removed and marked with silk sutures and a 6 color ink kit to orient the pathologist.  3D Faxitron imaging was performed and looked good as described above.  The specimen was sent to the lab where the seed was retrieved.  Hemostasis was excellent.  The wound was irrigated.  5 metal marker clips were  placed in the walls of the lumpectomy cavity.  The breast tissues were closed in layers with 3-0 Vicryl sutures and the skin closed with a running subcuticular 4-0 Monocryl and Dermabond.  Clean bandages and a breast binder were placed.  The patient tolerated the procedure well and was taken to PACU in stable condition.  EBL 20 cc.  Counts correct.  Complications none.    Addendum: I logged onto the PMP aware website and reviewed her prescription medication history.     Edsel Petrin. Dalbert Batman, M.D., FACS General and Minimally Invasive Surgery Breast and Colorectal Surgery  07/20/2018 12:29 PM

## 2018-07-20 NOTE — Anesthesia Preprocedure Evaluation (Signed)
Anesthesia Evaluation  Patient identified by MRN, date of birth, ID band Patient awake    Reviewed: Allergy & Precautions, NPO status , Patient's Chart, lab work & pertinent test results  History of Anesthesia Complications (+) PONV  Airway Mallampati: I  TM Distance: >3 FB Neck ROM: Full    Dental   Pulmonary    Pulmonary exam normal        Cardiovascular Normal cardiovascular exam     Neuro/Psych    GI/Hepatic GERD  Medicated and Controlled,  Endo/Other    Renal/GU      Musculoskeletal   Abdominal   Peds  Hematology   Anesthesia Other Findings   Reproductive/Obstetrics                             Anesthesia Physical Anesthesia Plan  ASA: II  Anesthesia Plan: General   Post-op Pain Management:    Induction: Intravenous  PONV Risk Score and Plan: 3 and Ondansetron, Dexamethasone and Midazolam  Airway Management Planned: LMA  Additional Equipment:   Intra-op Plan:   Post-operative Plan: Extubation in OR  Informed Consent: I have reviewed the patients History and Physical, chart, labs and discussed the procedure including the risks, benefits and alternatives for the proposed anesthesia with the patient or authorized representative who has indicated his/her understanding and acceptance.       Plan Discussed with: Surgeon and CRNA  Anesthesia Plan Comments:         Anesthesia Quick Evaluation

## 2018-07-20 NOTE — Interval H&P Note (Signed)
History and Physical Interval Note:  07/20/2018 10:57 AM  Melinda English  has presented today for surgery, with the diagnosis of left breast cancer  The various methods of treatment have been discussed with the patient and family. After consideration of risks, benefits and other options for treatment, the patient has consented to  Procedure(s): LEFT BREAST LUMPECTOMY WITH RADIOACTIVE SEED LOCALIZATION (Left) as a surgical intervention .  The patient's history has been reviewed, patient examined, no change in status, stable for surgery.  I have reviewed the patient's chart and labs.  Questions were answered to the patient's satisfaction.     [name]

## 2018-07-20 NOTE — Anesthesia Procedure Notes (Signed)
Procedure Name: LMA Insertion Date/Time: 07/20/2018 11:32 AM Performed by: Burna Cash, CRNA Pre-anesthesia Checklist: Patient identified, Emergency Drugs available, Suction available and Patient being monitored Patient Re-evaluated:Patient Re-evaluated prior to induction Oxygen Delivery Method: Circle system utilized Preoxygenation: Pre-oxygenation with 100% oxygen Induction Type: IV induction Ventilation: Mask ventilation without difficulty LMA: LMA inserted LMA Size: 4.0 Number of attempts: 1 Airway Equipment and Method: Bite block Placement Confirmation: positive ETCO2 Tube secured with: Tape Dental Injury: Teeth and Oropharynx as per pre-operative assessment

## 2018-07-23 ENCOUNTER — Encounter (HOSPITAL_BASED_OUTPATIENT_CLINIC_OR_DEPARTMENT_OTHER): Payer: Self-pay | Admitting: General Surgery

## 2018-07-23 NOTE — Progress Notes (Signed)
Inform patient of Pathology report,. Breast pathology shows no cancer All that was found was complex sclerosing lesion and hyperplasia Obviously this is excellent news I will discuss this with her in detail at her next office visit Let me know that you contacted her   hmi

## 2018-08-30 ENCOUNTER — Encounter (INDEPENDENT_AMBULATORY_CARE_PROVIDER_SITE_OTHER): Payer: Self-pay

## 2018-09-10 ENCOUNTER — Ambulatory Visit (INDEPENDENT_AMBULATORY_CARE_PROVIDER_SITE_OTHER): Payer: Federal, State, Local not specified - PPO | Admitting: Internal Medicine

## 2018-10-12 DIAGNOSIS — L578 Other skin changes due to chronic exposure to nonionizing radiation: Secondary | ICD-10-CM | POA: Diagnosis not present

## 2018-10-12 DIAGNOSIS — C44712 Basal cell carcinoma of skin of right lower limb, including hip: Secondary | ICD-10-CM | POA: Diagnosis not present

## 2018-10-12 DIAGNOSIS — D225 Melanocytic nevi of trunk: Secondary | ICD-10-CM | POA: Diagnosis not present

## 2018-10-12 DIAGNOSIS — L719 Rosacea, unspecified: Secondary | ICD-10-CM | POA: Diagnosis not present

## 2018-10-12 DIAGNOSIS — L570 Actinic keratosis: Secondary | ICD-10-CM | POA: Diagnosis not present

## 2019-03-15 DIAGNOSIS — Z0000 Encounter for general adult medical examination without abnormal findings: Secondary | ICD-10-CM | POA: Diagnosis not present

## 2019-03-15 DIAGNOSIS — E782 Mixed hyperlipidemia: Secondary | ICD-10-CM | POA: Diagnosis not present

## 2019-03-19 DIAGNOSIS — Z0001 Encounter for general adult medical examination with abnormal findings: Secondary | ICD-10-CM | POA: Diagnosis not present

## 2019-03-19 DIAGNOSIS — E782 Mixed hyperlipidemia: Secondary | ICD-10-CM | POA: Diagnosis not present

## 2019-03-19 DIAGNOSIS — K219 Gastro-esophageal reflux disease without esophagitis: Secondary | ICD-10-CM | POA: Diagnosis not present

## 2019-03-19 DIAGNOSIS — Z6829 Body mass index (BMI) 29.0-29.9, adult: Secondary | ICD-10-CM | POA: Diagnosis not present

## 2019-10-19 DIAGNOSIS — G43009 Migraine without aura, not intractable, without status migrainosus: Secondary | ICD-10-CM | POA: Diagnosis not present

## 2019-10-19 DIAGNOSIS — J069 Acute upper respiratory infection, unspecified: Secondary | ICD-10-CM | POA: Diagnosis not present

## 2020-02-07 IMAGING — MG DIGITAL DIAGNOSTIC UNILATERAL LEFT MAMMOGRAM WITH TOMO AND CAD
4 series · 4 of 12 positions shown · non-contrast
Comparison: Previous exam(s).

CLINICAL DATA: 62-year-old female for further evaluation of
possible LEFT breast distortion on screening mammogram

EXAM:
DIGITAL DIAGNOSTIC LEFT MAMMOGRAM WITH TOMO
ULTRASOUND LEFT BREAST

[L CC synth-2D]
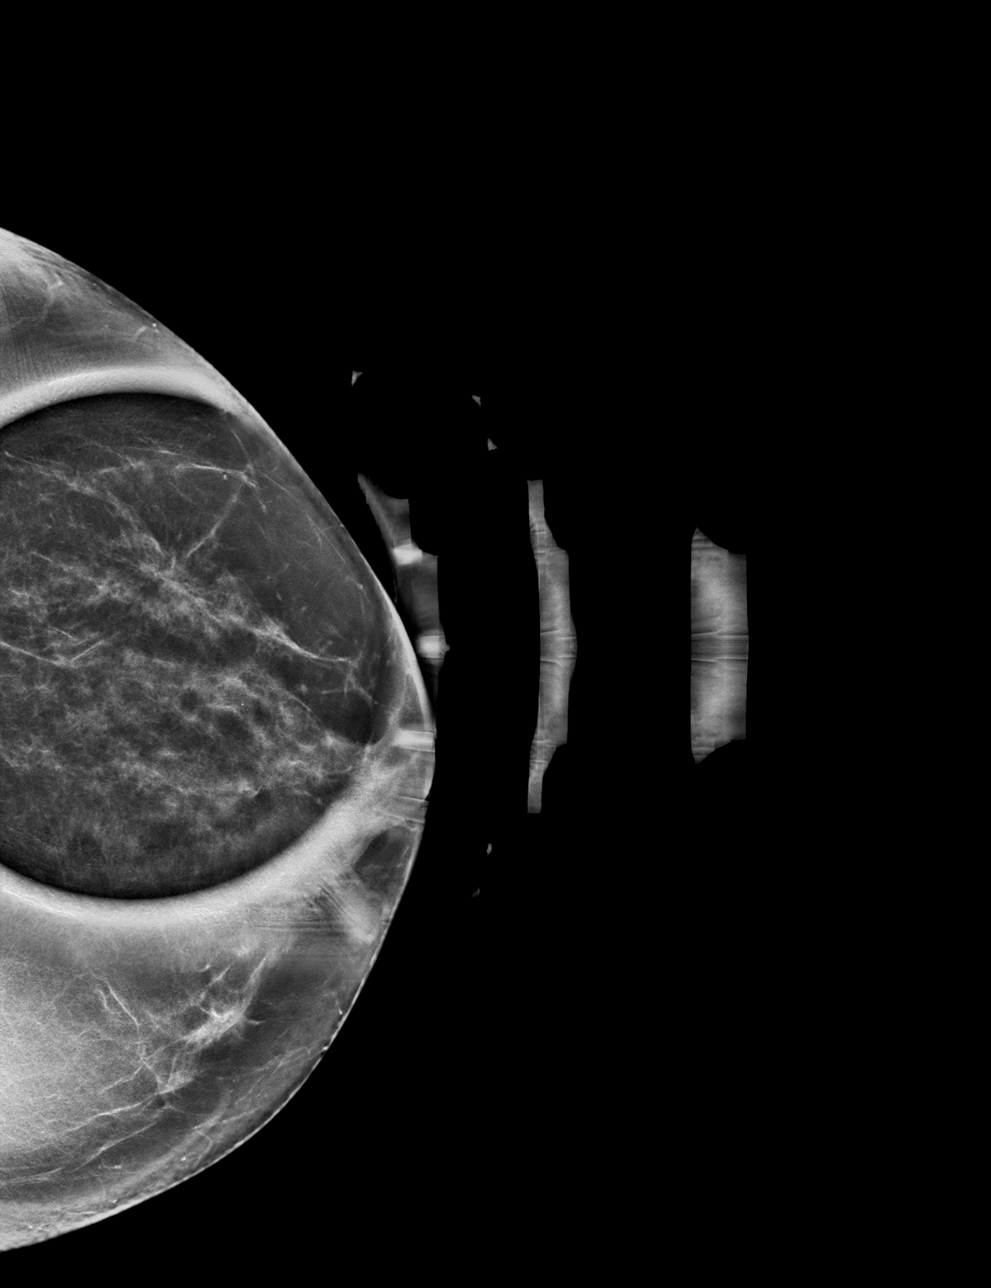

[L MLO synth-2D]
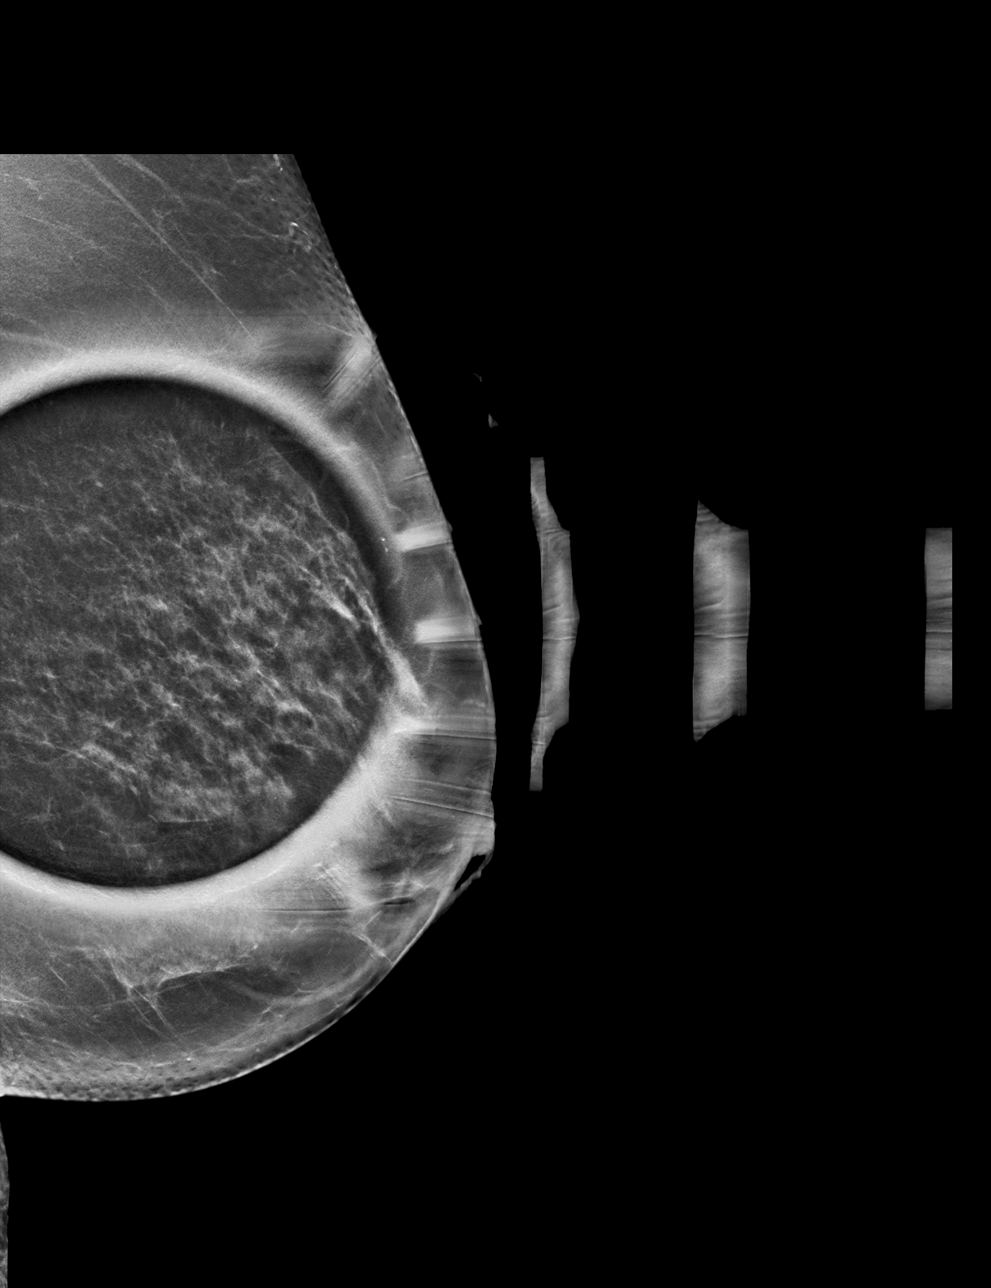

[L MLO tomo · tomo slice 32/63.0]
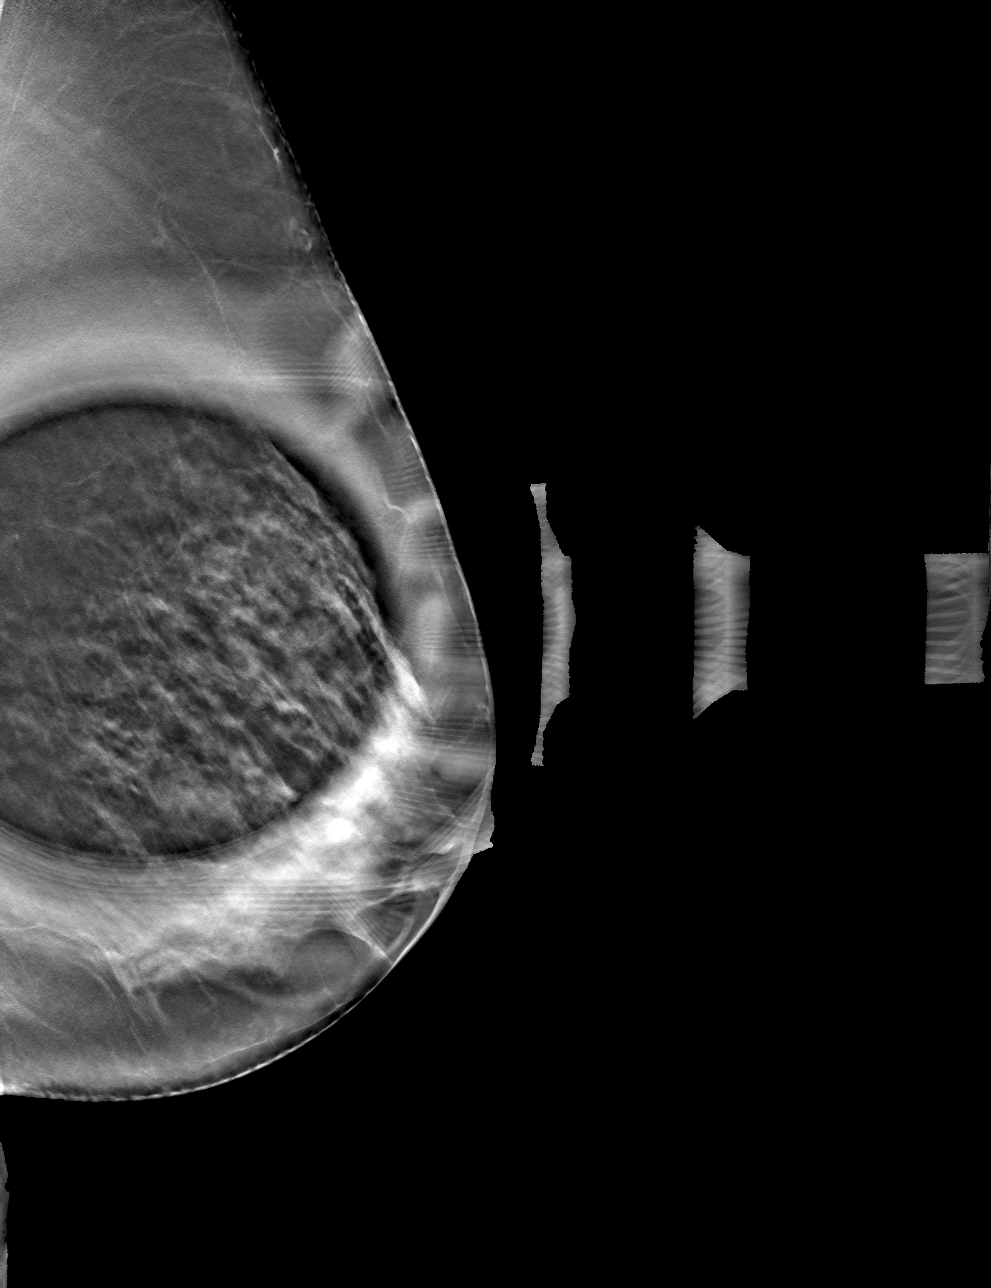

[L CC tomo · tomo slice 31/62.0]
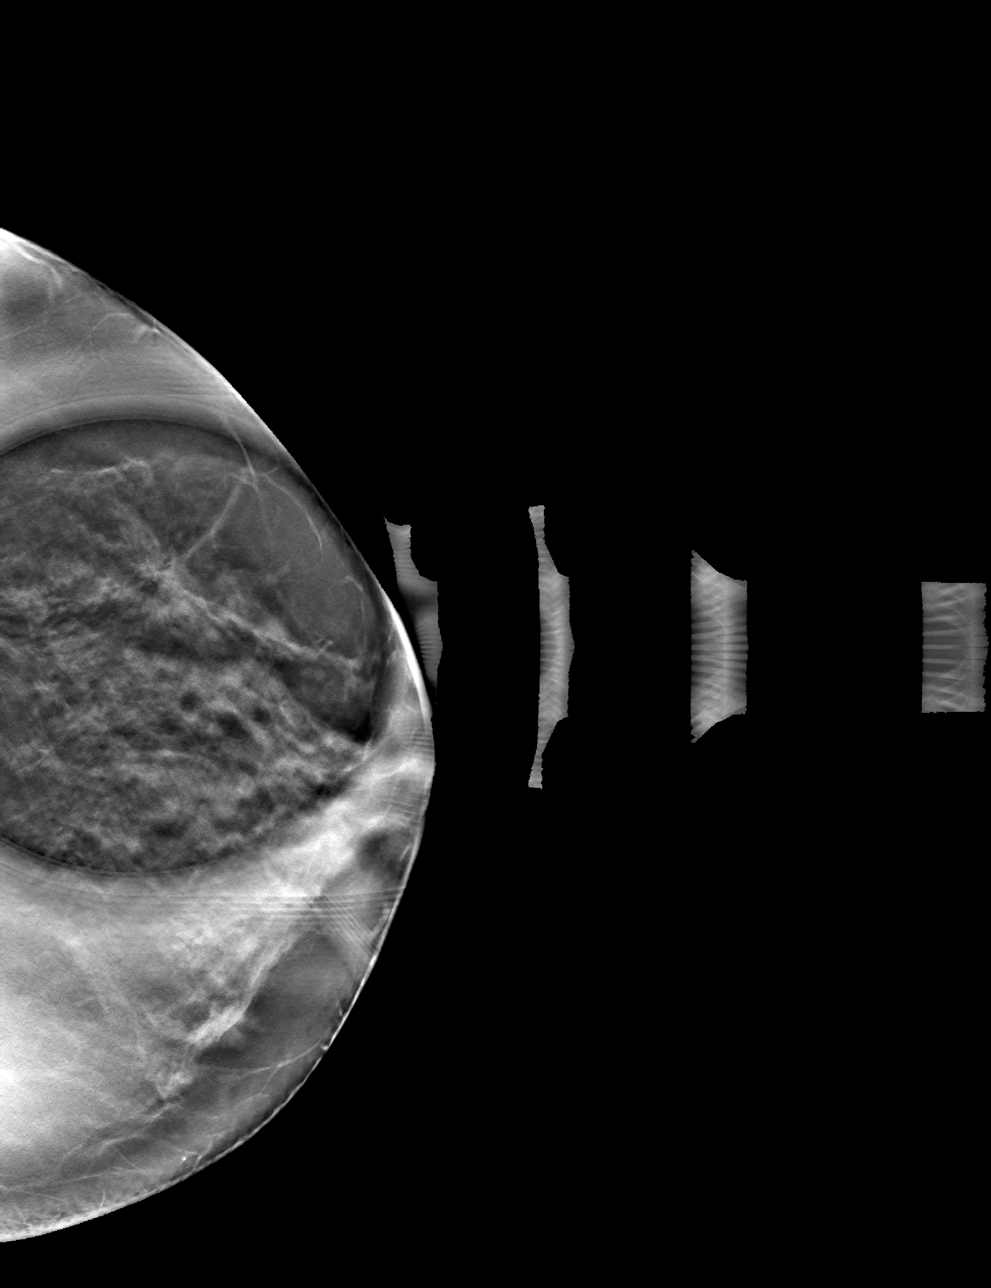

[4 of 12 positions shown; findings below may reference images not displayed]

ACR Breast Density Category c: The breast tissue is heterogeneously
dense, which may obscure small masses.
FINDINGS: 2D/3D spot compression views of the LEFT breast demonstrate
persistent distortion within the OUTER LEFT breast, best identified
on the CC view.

Targeted ultrasound is performed, showing no definite sonographic
correlate to the LEFT breast distortion identified mammographically.

No abnormal LEFT axillary lymph nodes are identified.
IMPRESSION: OUTER LEFT breast distortion without sonographic correlate or
enlarged LEFT axillary lymph nodes. Tissue sampling is recommended.

RECOMMENDATION:
3D/stereotactic guided LEFT breast biopsy, which will be arranged.

I have discussed the findings and recommendations with the patient.
Results were also provided in writing at the conclusion of the
visit. If applicable, a reminder letter will be sent to the patient
regarding the next appointment.

BI-RADS CATEGORY  4: Suspicious.

## 2020-02-18 DIAGNOSIS — Z20822 Contact with and (suspected) exposure to covid-19: Secondary | ICD-10-CM | POA: Diagnosis not present

## 2020-03-16 IMAGING — DX BREAST SURGICAL SPECIMEN
1 series · 2 of 2 positions shown · non-contrast
Comparison: Previous exam(s).

CLINICAL DATA: Evaluate specimen

EXAM:
SPECIMEN RADIOGRAPH OF THE LEFT BREAST

[Series 3: specimen digital x-ray, derived · left · 2 of 2 slices shown]
[im 1/2]
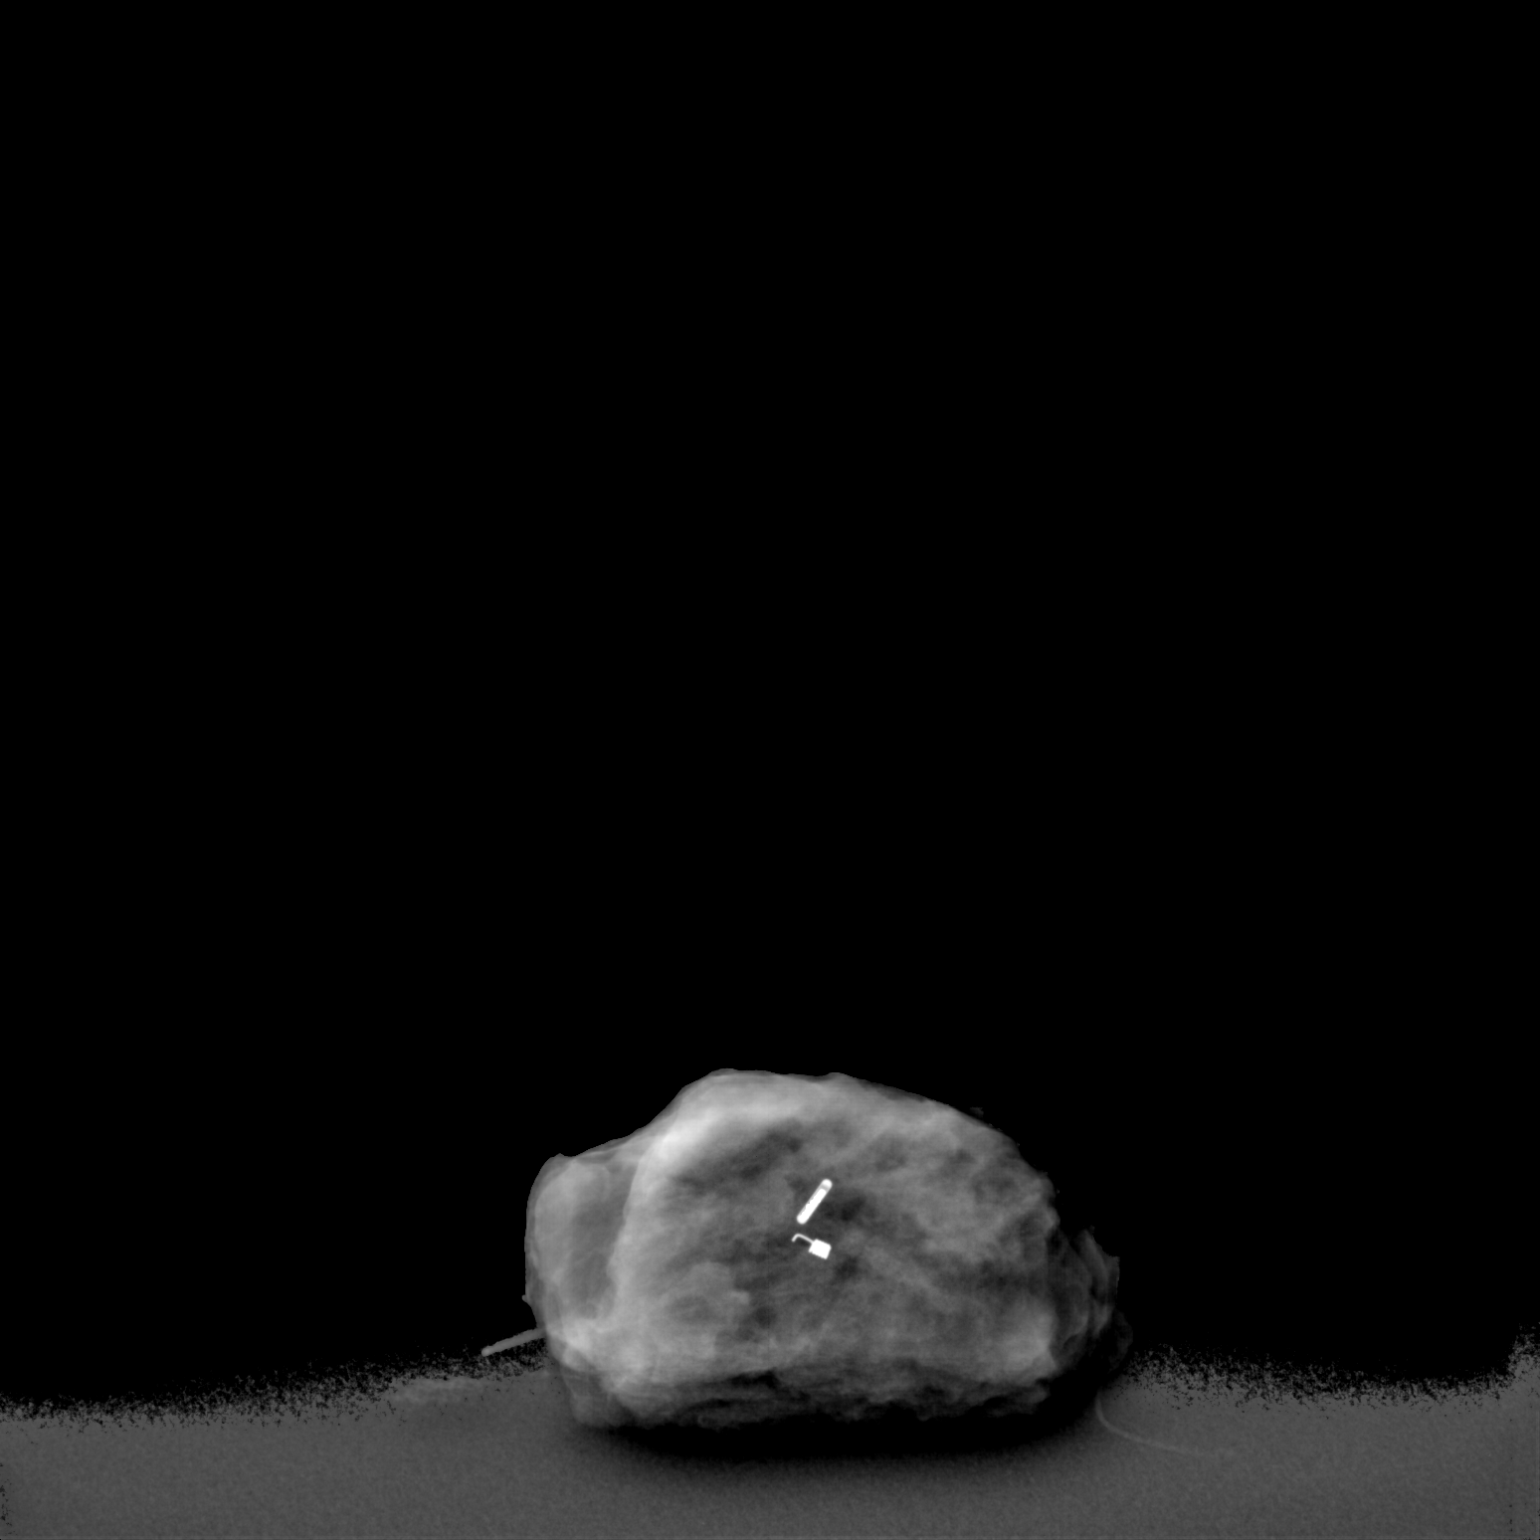
[im 2/2]
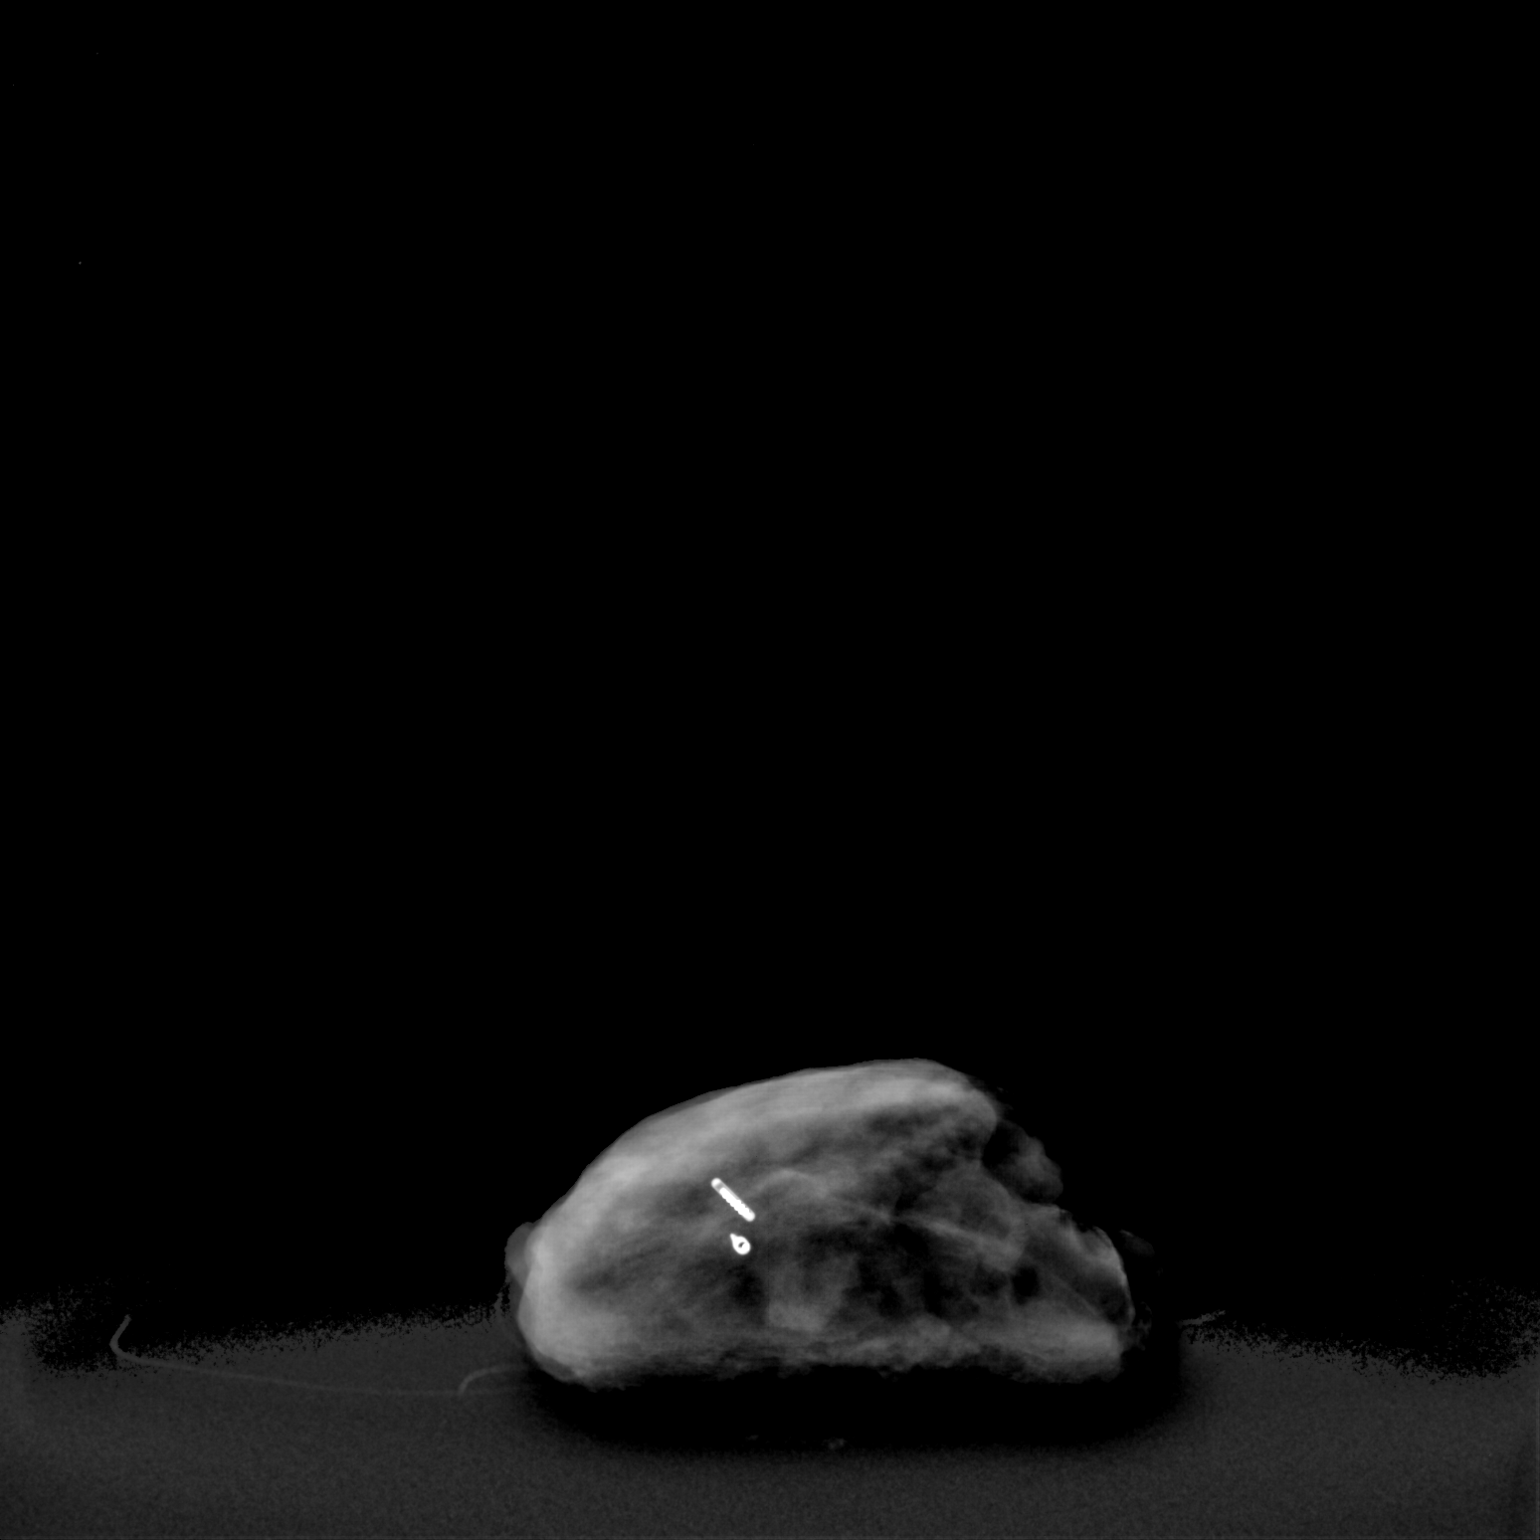

[2 of 2 positions shown; findings below may reference images not displayed]

FINDINGS: Status post excision of the left breast. The radioactive seed and
biopsy marker clip are present, completely intact, and were marked
for pathology.
IMPRESSION: Specimen radiograph of the left breast.

## 2020-03-17 DIAGNOSIS — B351 Tinea unguium: Secondary | ICD-10-CM | POA: Diagnosis not present

## 2020-05-25 DIAGNOSIS — B351 Tinea unguium: Secondary | ICD-10-CM | POA: Diagnosis not present

## 2020-05-26 DIAGNOSIS — R051 Acute cough: Secondary | ICD-10-CM | POA: Diagnosis not present

## 2020-05-26 DIAGNOSIS — J00 Acute nasopharyngitis [common cold]: Secondary | ICD-10-CM | POA: Diagnosis not present

## 2020-05-28 DIAGNOSIS — Z20822 Contact with and (suspected) exposure to covid-19: Secondary | ICD-10-CM | POA: Diagnosis not present

## 2020-06-22 ENCOUNTER — Other Ambulatory Visit (HOSPITAL_COMMUNITY): Payer: Self-pay | Admitting: Internal Medicine

## 2020-06-22 DIAGNOSIS — Z1231 Encounter for screening mammogram for malignant neoplasm of breast: Secondary | ICD-10-CM

## 2020-07-03 ENCOUNTER — Ambulatory Visit (HOSPITAL_COMMUNITY): Payer: Federal, State, Local not specified - PPO

## 2020-07-06 DIAGNOSIS — R059 Cough, unspecified: Secondary | ICD-10-CM | POA: Diagnosis not present

## 2020-07-06 DIAGNOSIS — U071 COVID-19: Secondary | ICD-10-CM | POA: Diagnosis not present

## 2020-07-06 DIAGNOSIS — M791 Myalgia, unspecified site: Secondary | ICD-10-CM | POA: Diagnosis not present

## 2020-07-12 ENCOUNTER — Ambulatory Visit
Admission: EM | Admit: 2020-07-12 | Discharge: 2020-07-12 | Disposition: A | Discharge status: Home / Self Care | Payer: Federal, State, Local not specified - PPO | Attending: Emergency Medicine | Admitting: Emergency Medicine

## 2020-07-12 ENCOUNTER — Ambulatory Visit (INDEPENDENT_AMBULATORY_CARE_PROVIDER_SITE_OTHER): Payer: Federal, State, Local not specified - PPO

## 2020-07-12 ENCOUNTER — Other Ambulatory Visit: Payer: Self-pay

## 2020-07-12 ENCOUNTER — Encounter: Payer: Self-pay | Admitting: Emergency Medicine

## 2020-07-12 DIAGNOSIS — R0989 Other specified symptoms and signs involving the circulatory and respiratory systems: Secondary | ICD-10-CM | POA: Diagnosis not present

## 2020-07-12 DIAGNOSIS — R059 Cough, unspecified: Secondary | ICD-10-CM

## 2020-07-12 DIAGNOSIS — Z20822 Contact with and (suspected) exposure to covid-19: Secondary | ICD-10-CM

## 2020-07-12 DIAGNOSIS — R058 Other specified cough: Secondary | ICD-10-CM

## 2020-07-12 DIAGNOSIS — U071 COVID-19: Secondary | ICD-10-CM | POA: Diagnosis not present

## 2020-07-12 DIAGNOSIS — J1282 Pneumonia due to coronavirus disease 2019: Secondary | ICD-10-CM

## 2020-07-12 MED ORDER — ALBUTEROL SULFATE HFA 108 (90 BASE) MCG/ACT IN AERS: 1.0000 | INHALATION_SPRAY | Freq: Four times a day (QID) | RESPIRATORY_TRACT | 0 refills | Status: DC | PRN

## 2020-07-12 MED ORDER — BENZONATATE 100 MG PO CAPS: ORAL_CAPSULE | ORAL | 0 refills | Status: DC

## 2020-07-12 MED ORDER — PREDNISONE 10 MG PO TABS: 20.0000 mg | ORAL_TABLET | Freq: Every day | ORAL | 0 refills | Status: DC

## 2020-07-12 NOTE — Discharge Instructions (Addendum)
Get plenty of rest and push fluids Tessalon Perles prescribed for cough Continue to use Hydromet for severe cough Albuterol was prescribed  prednisone was prescribed Use medications daily for symptom relief Use OTC medications like ibuprofen or tylenol as needed fever or pain Call or go to the ED if you have any new or worsening symptoms such as fever, worsening cough, shortness of breath, chest tightness, chest pain, turning blue, changes in mental status, etc.

## 2020-07-12 NOTE — ED Provider Notes (Signed)
[facility]   [id number] 07/12/20 Arrival Time: 1426   CC: COVID symptoms  SUBJECTIVE: History from: patient.  Melinda English is a 65 y.o. female who presented to the urgent care with a complaint of being Covid positive x13 days and has been  complaining of chronic cough since.  Denies sick exposure to flu or strep.  Denies recent travel.  Has tried OTC medication without relief.  Denies alleviating or aggravating factors.  Denies previous symptoms in the past.   Denies fever, chills, fatigue, sinus pain, rhinorrhea, sore throat, SOB, wheezing, chest pain, nausea, changes in bowel or bladder habits.     ROS: As per HPI.  All other pertinent ROS negative.      Past Medical History:  Diagnosis Date  . Abnormal mammogram of left breast 07/20/2018  . Chronic rhinitis    Allergy testing 2018: positives to weeds,trees, one mold, and dust mites.  . Chronic venous hypertension w/o comp of bilateral low extrm 09/2016   Vein/vascular clinic in [address]  . Family history of abdominal aortic aneurysm 01/2015   Screening aortic u/s NEG  . Glaucoma   . History of hypothyroidism    was on armour thyroid 60mg  qd at one point--then stopped taking med old records don't have any f/u thyroid labs after this  . History of narrow angle glaucoma    angle narrowing  . Migraines   . Mitral valve prolapse    with trace regurg per old records  . PONV (postoperative nausea and vomiting)   . Pruritus 07/2016   with erythema: rx'd high dose antihistamines bid by allergist  . Stillborn, normal   . Urine incontinence   . Venous stasis of lower extremity 09/2016   Right   Past Surgical History:  Procedure Laterality Date  . ABDOMINAL HYSTERECTOMY  2004   with unilateral salpingooopherectomy (endometriosis)  . BREAST LUMPECTOMY WITH RADIOACTIVE SEED LOCALIZATION Left 07/20/2018   Procedure: LEFT BREAST LUMPECTOMY WITH RADIOACTIVE SEED LOCALIZATION;  Surgeon: 09/18/2018, MD;   Location: [facility];  Service: General;  Laterality: Left;  . CHOLECYSTECTOMY  2004  . COLONOSCOPY  10/2002   Normal  . COLONOSCOPY N/A 05/10/2017   Procedure: COLONOSCOPY;  Surgeon: 05/12/2017, MD;  [facility];  Service: Endoscopy;  Laterality: N/A;  1:00  . ESOPHAGEAL DILATION N/A 05/10/2017   Procedure: ESOPHAGEAL DILATION;  Surgeon: 05/12/2017, MD;  Location: [facility];  Service: Endoscopy;  Laterality: N/A;  . ESOPHAGOGASTRODUODENOSCOPY N/A 05/10/2017   Procedure: ESOPHAGOGASTRODUODENOSCOPY (EGD);  Surgeon: 05/12/2017, MD;  Location: [facility];  Service: Endoscopy;  Laterality: N/A;  . EYE SURGERY Bilateral 2004   for narrow angle glaucoma--laser   . INCONTINENCE SURGERY    . OOPHORECTOMY Right 1988   dermoid cyst  . PARTIAL COLECTOMY  10/2002   Segmental resection of sigmoid.  Endometriosis was on colon as well--approx 6 inches.   No Known Allergies No current facility-administered medications on file prior to encounter.   Current Outpatient Medications on File Prior to Encounter  Medication Sig Dispense Refill  . Ascorbic Acid (VITAMIN C) 1000 MG tablet Take 1,000 mg by mouth daily.    11/2002 CALCIUM-MAGNESIUM-ZINC PO Take 1 tablet by mouth daily.    Marland Kitchen HYDROcodone-acetaminophen (NORCO) 5-325 MG tablet Take 1-2 tablets by mouth every 6 (six) hours as needed for moderate pain or severe pain. 20 tablet 0  . Misc Natural Products (OSTEO BI-FLEX JOINT SHIELD PO)  Take 1 tablet by mouth daily.     . NON FORMULARY as needed. Bio E with Selenium    . Riboflavin (B2) 100 MG TABS Take 1 tablet by mouth daily.    . SUMAtriptan (IMITREX) 100 MG tablet TAKE 1 TABLET BY MOUTH DAILY AS NEEDED FOR MIGRAINE. MAY REPEAT IN 2 HOURS IF HEADACHE PERSISTS 20 tablet 0  . SUMAtriptan (IMITREX) 100 MG tablet TAKE 1 TABLET BY MOUTH DAILY AS NEEDED FOR MIGRAINE. MAY REPEAT IN 2 HOURS IF HEADACHE PERSISTS 10 tablet 3  . VITAMIN E PO Take 1 tablet by mouth  daily as needed.     Social History   Socioeconomic History  . Marital status: Married    Spouse name: Richard  . Number of children: 2  . Years of education: 12  . Highest education level: Not on file  Occupational History  . Occupation: Engineer, materials    Comment: first [employer]  Tobacco Use  . Smoking status: Never Smoker  . Smokeless tobacco: Never Used  Vaping Use  . Vaping Use: Never used  Substance and Sexual Activity  . Alcohol use: No  . Drug use: No  . Sexual activity: Yes    Birth control/protection: Post-menopausal, Surgical  Other Topics Concern  . Not on file  Social History Narrative   Married, 2 daughters.   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   Social Determinants of Health   Financial Resource Strain: Not on file  Food Insecurity: Not on file  Transportation Needs: Not on file  Physical Activity: Not on file  Stress: Not on file  Social Connections: Not on file  Intimate Partner Violence: Not on file   Family History  Problem Relation Age of Onset  . Arthritis Mother   . Heart disease Father   . Atrial fibrillation Father   . Diabetes Father   . AAA (abdominal aortic aneurysm) Father   . Cancer Father        pancoast tumor  . Breast cancer Sister   . Cancer Sister   . Colon cancer Maternal Uncle   . Colon cancer Maternal Uncle   . Diabetes Daughter   . Allergic rhinitis Neg Hx   . Asthma Neg Hx   . Atopy Neg Hx   . Angioedema Neg Hx   . Eczema Neg Hx   . Immunodeficiency Neg Hx   . Urticaria Neg Hx     OBJECTIVE:  Vitals:   07/12/20 1440 07/12/20 1445  BP: (!) 154/74   Pulse: 66   Resp: 20   Temp: 98.3 F (36.8 C)   TempSrc: Oral   SpO2: 96%   Weight:  172 lb (78 kg)  Height:  5\' 6"  (1.676 m)     General appearance: alert; appears fatigued, but nontoxic; speaking in full sentences and tolerating own secretions HEENT: NCAT; Ears: EACs clear, TMs pearly gray; Eyes: PERRL.  EOM grossly  intact. Sinuses: nontender; Nose: nares patent without rhinorrhea, Throat: oropharynx clear, tonsils non erythematous or enlarged, uvula midline  Neck: supple without LAD Lungs: unlabored respirations, symmetrical air entry; cough: moderate; no respiratory distress; CTAB Heart: regular rate and rhythm.  Radial pulses 2+ symmetrical bilaterally Skin: warm and dry Psychological: alert and cooperative; normal mood and affect  LABS:  No results found for this or any previous visit (from the past 24 hour(s)).   RADIOLOGY  DG Chest 2 View  Result Date: 07/12/2020 CLINICAL DATA:  Chest  congestion. COVID diagnosis 14 days ago. Cough. EXAM: CHEST - 2 VIEW COMPARISON:  None. FINDINGS: Mild hazy peripheral opacities are present in the left mid lung. Indistinct right infrahilar opacity. Bilateral airway thickening. Cardiac and mediastinal margins appear normal. No blunting of the costophrenic angles. Thoracic spondylosis noted. IMPRESSION: 1. Hazy peripheral opacities in the left mid lung and right infrahilar lung, not entirely specific but concerning for potential COVID pneumonia. 2. Airway thickening is present, suggesting bronchitis or reactive airways disease. Electronically Signed   By: Gaylyn Rong M.D.   On: 07/12/2020 15:06     X-ray is positive for potential Covid pneumonia.  I have reviewed the x-ray myself and the radiologist interpretation.  I am in agreement with the radiologist interpretation.  ASSESSMENT & PLAN:  1. COVID-19 virus infection   2. Pneumonia due to COVID-19 virus   3. Cough with exposure to COVID-19 virus     Meds ordered this encounter  Medications  . albuterol (VENTOLIN HFA) 108 (90 Base) MCG/ACT inhaler    Sig: Inhale 1-2 puffs into the lungs every 6 (six) hours as needed for wheezing or shortness of breath.    Dispense:  18 g    Refill:  0  . predniSONE (DELTASONE) 10 MG tablet    Sig: Take 2 tablets (20 mg total) by mouth daily.    Dispense:  15 tablet     Refill:  0  . benzonatate (TESSALON) 100 MG capsule    Sig: Take 1 capsule by mouth every 4-8 hours as needed for cough    Dispense:  30 capsule    Refill:  0    Discharge Instructions  Get plenty of rest and push fluids Tessalon Perles prescribed for cough Continue to use Hydromet for severe cough Albuterol was prescribed  prednisone was prescribed Use medications daily for symptom relief Use OTC medications like ibuprofen or tylenol as needed fever or pain Call or go to the ED if you have any new or worsening symptoms such as fever, worsening cough, shortness of breath, chest tightness, chest pain, turning blue, changes in mental status, etc...   Reviewed expectations re: course of current medical issues. Questions answered. Outlined signs and symptoms indicating need for more acute intervention. Patient verbalized understanding. After Visit Summary given.         Durward Parcel, FNP 07/12/20 1540

## 2020-07-12 NOTE — ED Triage Notes (Signed)
Pt covid + on 06/29/2020.  Has had cough every since and feels like it has moved into her chest.

## 2020-07-18 DIAGNOSIS — N3000 Acute cystitis without hematuria: Secondary | ICD-10-CM | POA: Diagnosis not present

## 2020-07-22 DIAGNOSIS — R208 Other disturbances of skin sensation: Secondary | ICD-10-CM | POA: Diagnosis not present

## 2020-07-22 DIAGNOSIS — Z1283 Encounter for screening for malignant neoplasm of skin: Secondary | ICD-10-CM | POA: Diagnosis not present

## 2020-07-22 DIAGNOSIS — L258 Unspecified contact dermatitis due to other agents: Secondary | ICD-10-CM | POA: Diagnosis not present

## 2020-07-22 DIAGNOSIS — D225 Melanocytic nevi of trunk: Secondary | ICD-10-CM | POA: Diagnosis not present

## 2020-07-31 DIAGNOSIS — H5203 Hypermetropia, bilateral: Secondary | ICD-10-CM | POA: Diagnosis not present

## 2020-07-31 DIAGNOSIS — H2513 Age-related nuclear cataract, bilateral: Secondary | ICD-10-CM | POA: Diagnosis not present

## 2020-07-31 DIAGNOSIS — Z8669 Personal history of other diseases of the nervous system and sense organs: Secondary | ICD-10-CM | POA: Diagnosis not present

## 2020-07-31 DIAGNOSIS — G43009 Migraine without aura, not intractable, without status migrainosus: Secondary | ICD-10-CM | POA: Diagnosis not present

## 2020-07-31 DIAGNOSIS — H524 Presbyopia: Secondary | ICD-10-CM | POA: Diagnosis not present

## 2020-07-31 DIAGNOSIS — E782 Mixed hyperlipidemia: Secondary | ICD-10-CM | POA: Diagnosis not present

## 2020-07-31 DIAGNOSIS — Z0000 Encounter for general adult medical examination without abnormal findings: Secondary | ICD-10-CM | POA: Diagnosis not present

## 2020-07-31 DIAGNOSIS — J069 Acute upper respiratory infection, unspecified: Secondary | ICD-10-CM | POA: Diagnosis not present

## 2020-07-31 DIAGNOSIS — Z6829 Body mass index (BMI) 29.0-29.9, adult: Secondary | ICD-10-CM | POA: Diagnosis not present

## 2020-08-14 DIAGNOSIS — Z0001 Encounter for general adult medical examination with abnormal findings: Secondary | ICD-10-CM | POA: Diagnosis not present

## 2020-08-28 ENCOUNTER — Ambulatory Visit (HOSPITAL_COMMUNITY)
Admission: RE | Admit: 2020-08-28 | Discharge: 2020-08-28 | Disposition: A | Discharge status: Home / Self Care | Payer: Medicare HMO | Source: Ambulatory Visit | Attending: Internal Medicine | Admitting: Internal Medicine

## 2020-08-28 ENCOUNTER — Other Ambulatory Visit: Payer: Self-pay

## 2020-08-28 DIAGNOSIS — Z1231 Encounter for screening mammogram for malignant neoplasm of breast: Secondary | ICD-10-CM | POA: Insufficient documentation

## 2021-02-26 ENCOUNTER — Emergency Department (HOSPITAL_COMMUNITY): Payer: Medicare HMO

## 2021-02-26 ENCOUNTER — Emergency Department (HOSPITAL_COMMUNITY)
Admission: EM | Admit: 2021-02-26 | Discharge: 2021-02-26 | Disposition: A | Discharge status: Home / Self Care | Payer: Medicare HMO | Attending: Emergency Medicine | Admitting: Emergency Medicine

## 2021-02-26 ENCOUNTER — Ambulatory Visit: Payer: Medicare HMO

## 2021-02-26 ENCOUNTER — Encounter (HOSPITAL_COMMUNITY): Payer: Self-pay | Admitting: *Deleted

## 2021-02-26 ENCOUNTER — Other Ambulatory Visit: Payer: Self-pay

## 2021-02-26 DIAGNOSIS — M79604 Pain in right leg: Secondary | ICD-10-CM | POA: Diagnosis not present

## 2021-02-26 DIAGNOSIS — Z79899 Other long term (current) drug therapy: Secondary | ICD-10-CM | POA: Diagnosis not present

## 2021-02-26 DIAGNOSIS — Z8679 Personal history of other diseases of the circulatory system: Secondary | ICD-10-CM | POA: Diagnosis not present

## 2021-02-26 DIAGNOSIS — Z8249 Family history of ischemic heart disease and other diseases of the circulatory system: Secondary | ICD-10-CM | POA: Diagnosis not present

## 2021-02-26 DIAGNOSIS — M79651 Pain in right thigh: Secondary | ICD-10-CM

## 2021-02-26 DIAGNOSIS — M7989 Other specified soft tissue disorders: Secondary | ICD-10-CM | POA: Diagnosis not present

## 2021-02-26 NOTE — ED Triage Notes (Signed)
Right leg pain, concerned about a blood clot

## 2021-02-26 NOTE — Discharge Instructions (Addendum)
Exam and imaging are reassuring.  I recommend over-the-counter pain medications as needed.  May apply heat to the area and or ice as well as stretches out the muscles this can help decrease pain and inflammation.  Please call your PCP for further evaluation.  Come back to the emergency department if you develop chest pain, shortness of breath, severe abdominal pain, uncontrolled nausea, vomiting, diarrhea.

## 2021-02-26 NOTE — ED Provider Notes (Signed)
[facility] EMERGENCY DEPARTMENT Provider Note   CSN: [id number] Arrival date & time: 02/26/21  1548     History Chief Complaint  Patient presents with   Leg Pain    Melinda English is a 65 y.o. female.  HPI  Patient with no significant medical history presents to the emergency department with chief complaint of right thigh pain.  Patient states pain started yesterday, pain came on suddenly, states the pain is intermittent, does not radiate, no associated with paresthesia or weakness and/or edema in that right leg.  She denies recent trauma to the area, denies history of PEs or DVTs, currently not on hormone therapy, denies history of IV drug use, no associated chest pain, shortness of breath, fevers or chills.  Patient states she is here today because she is concerned for possible blood clot.  She has no other complaints.  She does not endorse chest pain, shortness of breath, abdominal pain, nausea, vomit, diarrhea.  Past Medical History:  Diagnosis Date   Abnormal mammogram of left breast 07/20/2018   Chronic rhinitis    Allergy testing 2018: positives to weeds,trees, one mold, and dust mites.   Chronic venous hypertension w/o comp of bilateral low extrm 09/2016   Vein/vascular clinic in [address] [place]   Family history of abdominal aortic aneurysm 01/2015   Screening aortic u/s NEG   Glaucoma    History of hypothyroidism    was on armour thyroid 60mg  qd at one point--then stopped taking med old records don't have any f/u thyroid labs after this   History of narrow angle glaucoma    angle narrowing   Migraines    Mitral valve prolapse    with trace regurg per old records   PONV (postoperative nausea and vomiting)    Pruritus 07/2016   with erythema: rx'd high dose antihistamines bid by allergist   Stillborn, normal    Urine incontinence    Venous stasis of lower extremity 09/2016   Right    Patient Active Problem List   Diagnosis Date Noted   Abnormal mammogram  of left breast 07/20/2018   Esophageal dysphagia 02/02/2017   Encounter for screening colonoscopy 02/02/2017   Migraine headache 01/10/2017   Environmental allergies 01/10/2017   Chronic GERD 01/10/2017   Dysphagia 01/10/2017   Hip pain, chronic, unspecified laterality 01/10/2017   Varicose veins of left lower extremity 01/10/2017   Venous insufficiency 01/10/2017    Past Surgical History:  Procedure Laterality Date   ABDOMINAL HYSTERECTOMY  2004   with unilateral salpingooopherectomy (endometriosis)   BREAST LUMPECTOMY WITH RADIOACTIVE SEED LOCALIZATION Left 07/20/2018   Procedure: LEFT BREAST LUMPECTOMY WITH RADIOACTIVE SEED LOCALIZATION;  Surgeon: 09/18/2018, MD;  Location:  [facility];  Service: General;  Laterality: Left;   CHOLECYSTECTOMY  2004   COLONOSCOPY  10/2002   Normal   COLONOSCOPY N/A 05/10/2017   Procedure: COLONOSCOPY;  Surgeon: 05/12/2017, MD;  [facility];  Service: Endoscopy;  Laterality: N/A;  1:00   ESOPHAGEAL DILATION N/A 05/10/2017   Procedure: ESOPHAGEAL DILATION;  Surgeon: 05/12/2017, MD;  [facility];  Service: Endoscopy;  Laterality: N/A;   ESOPHAGOGASTRODUODENOSCOPY N/A 05/10/2017   Procedure: ESOPHAGOGASTRODUODENOSCOPY (EGD);  Surgeon: 05/12/2017, MD;  [facility];  Service: Endoscopy;  Laterality: N/A;   EYE SURGERY Bilateral 2004   for narrow angle glaucoma--laser    INCONTINENCE SURGERY     OOPHORECTOMY Right 1988   dermoid cyst  PARTIAL COLECTOMY  10/2002   Segmental resection of sigmoid.  Endometriosis was on colon as well--approx 6 inches.     OB History   No obstetric history on file.     Family History  Problem Relation Age of Onset   Arthritis Mother    Heart disease Father    Atrial fibrillation Father    Diabetes Father    AAA (abdominal aortic aneurysm) Father    Cancer Father        pancoast tumor   Breast cancer Sister    Cancer Sister    Colon cancer  Maternal Uncle    Colon cancer Maternal Uncle    Diabetes Daughter    Allergic rhinitis Neg Hx    Asthma Neg Hx    Atopy Neg Hx    Angioedema Neg Hx    Eczema Neg Hx    Immunodeficiency Neg Hx    Urticaria Neg Hx     Social History   Tobacco Use   Smoking status: Never   Smokeless tobacco: Never  Vaping Use   Vaping Use: Never used  Substance Use Topics   Alcohol use: No   Drug use: No    Home Medications Prior to Admission medications   Medication Sig Start Date End Date Taking? Authorizing Provider  albuterol (VENTOLIN HFA) 108 (90 Base) MCG/ACT inhaler Inhale 1-2 puffs into the lungs every 6 (six) hours as needed for wheezing or shortness of breath. 07/12/20   Avegno, Zachery Dakins, FNP  Ascorbic Acid (VITAMIN C) 1000 MG tablet Take 1,000 mg by mouth daily.    [provider]  benzonatate (TESSALON) 100 MG capsule Take 1 capsule by mouth every 4-8 hours as needed for cough 07/12/20   Avegno, Zachery Dakins, FNP  CALCIUM-MAGNESIUM-ZINC PO Take 1 tablet by mouth daily.    [provider]  HYDROcodone-acetaminophen (NORCO) 5-325 MG tablet Take 1-2 tablets by mouth every 6 (six) hours as needed for moderate pain or severe pain. 07/20/18   Claud Kelp, MD  Misc Natural Products (OSTEO BI-FLEX JOINT SHIELD PO) Take 1 tablet by mouth daily.     [provider]  NON FORMULARY as needed. Bio E with Selenium    [provider]  predniSONE (DELTASONE) 10 MG tablet Take 2 tablets (20 mg total) by mouth daily. 07/12/20   Avegno, Zachery Dakins, FNP  Riboflavin (B2) 100 MG TABS Take 1 tablet by mouth daily.    [provider]  SUMAtriptan (IMITREX) 100 MG tablet TAKE 1 TABLET BY MOUTH DAILY AS NEEDED FOR MIGRAINE. MAY REPEAT IN 2 HOURS IF HEADACHE PERSISTS 09/01/17   Eustace Moore, MD  SUMAtriptan (IMITREX) 100 MG tablet TAKE 1 TABLET BY MOUTH DAILY AS NEEDED FOR MIGRAINE. MAY REPEAT IN 2 HOURS IF HEADACHE PERSISTS 12/04/17   Aliene Beams, MD  VITAMIN  E PO Take 1 tablet by mouth daily as needed.    [provider]    Allergies    Patient has no known allergies.  Review of Systems   Review of Systems  Constitutional:  Negative for chills and fever.  HENT:  Negative for congestion.   Respiratory:  Negative for shortness of breath.   Cardiovascular:  Negative for chest pain.  Gastrointestinal:  Negative for abdominal pain.  Genitourinary:  Negative for enuresis.  Musculoskeletal:  Negative for back pain.       Right thigh pain.  Skin:  Negative for rash.  Neurological:  Negative for dizziness.  Hematological:  Does  not bruise/bleed easily.   Physical Exam Updated Vital Signs BP 126/73 (BP Location: Right Arm)   Pulse 64   Temp 98.2 F (36.8 C) (Oral)   Resp 16   SpO2 99%   Physical Exam Vitals and nursing note reviewed.  Constitutional:      General: She is not in acute distress.    Appearance: She is not ill-appearing.  HENT:     Head: Normocephalic and atraumatic.     Nose: No congestion.  Eyes:     Conjunctiva/sclera: Conjunctivae normal.  Cardiovascular:     Rate and Rhythm: Normal rate and regular rhythm.     Pulses: Normal pulses.     Heart sounds: No murmur heard.   No friction rub. No gallop.  Pulmonary:     Effort: No respiratory distress.     Breath sounds: No wheezing, rhonchi or rales.  Musculoskeletal:     Right lower leg: No edema.     Left lower leg: No edema.     Comments: Patient has full range of motion, 5 5 strength neurovascular intact in the lower extremities, no pedal edema present, no palpable cords or calf tenderness present, patient has slight tenderness on the anterior aspect of her thigh no gross deformities present.  Skin:    General: Skin is warm and dry.  Neurological:     Mental Status: She is alert.  Psychiatric:        Mood and Affect: Mood normal.    ED Results / Procedures / Treatments   Labs (all labs ordered are listed, but only abnormal results are  displayed) Labs Reviewed - No data to display  EKG None  Radiology US Venous Img Lower Unilateral Right (DVT)  Result Date: 02/26/2021 CLINICAL DATA:  Leg swelling EXAM: RIGHT LOWER EXTREMITY VENOUS DOPPLER ULTRASOUND TECHNIQUE: Gray-scale sonography with compression, as well as color and duplex ultrasound, were performed to evaluate the deep venous system(s) from the level of the common femoral vein through the popliteal and proximal calf veins. COMPARISON:  None. FINDINGS: VENOUS Normal compressibility of the common femoral, superficial femoral, and popliteal veins, as well as the visualized calf veins. Visualized portions of profunda femoral vein and great saphenous vein unremarkable. No filling defects to suggest DVT on grayscale or color Doppler imaging. Doppler waveforms show normal direction of venous flow, normal respiratory plasticity and response to augmentation. Limited views of the contralateral common femoral vein are unremarkable. OTHER None. Limitations: none IMPRESSION: Negative. Electronically Signed   By: Malachy Moan M.D.   On: 02/26/2021 16:52    Procedures Procedures   Medications Ordered in ED Medications - No data to display  ED Course  I have reviewed the triage vital signs and the nursing notes.  Pertinent labs & imaging results that were available during my care of the patient were reviewed by me and considered in my medical decision making (see chart for details).    MDM Rules/Calculators/A&P                          Initial impression-patient presents with right leg pain.  She is alert, does not appear in acute stress, vital signs reassuring.  Will order DVT study for further evaluation.  Work-up-DVT study is negative for acute findings.  Rule out- I have low suspicion for septic arthritis as patient denies IV drug use, skin exam was performed no erythematous, edematous, warm joints noted on exam, no new heart murmur heard on  exam.  Low suspicion for  fracture or dislocation  no gross deformity present on my exam, there is no traumatic injury associated with this pain will defer imaging at this time.  low suspicion for ligament or tendon damage as area was palpated no gross defects noted, they had full range of motion as well as 5/5 strength.  Low suspicion for compartment syndrome as area was palpated it was soft to the touch, neurovascular fully intact.  No suspicion for DVT study is negative.  Low suspicion for PE as she denies chest pain, shortness of breath, vital signs reassuring nontachypneic, not tachycardic, nonhypoxic.   Plan-  Right thigh pain-likely muscular strain, will recommend over-the-counter pain medications, follow-up with PCP for further evaluation.  Vital signs have remained stable, no indication for hospital admission.   Patient given at home care as well strict return precautions.  Patient verbalized that they understood agreed to said plan.  Final Clinical Impression(s) / ED Diagnoses Final diagnoses:  Right leg pain    Rx / DC Orders ED Discharge Orders     None        Carroll Sage, PA-C 02/27/21 0004    Vanetta Mulders, MD 03/05/21 [phone]

## 2021-03-26 DIAGNOSIS — Z0100 Encounter for examination of eyes and vision without abnormal findings: Secondary | ICD-10-CM | POA: Diagnosis not present

## 2021-04-05 DIAGNOSIS — Z0000 Encounter for general adult medical examination without abnormal findings: Secondary | ICD-10-CM | POA: Diagnosis not present

## 2021-04-05 DIAGNOSIS — E782 Mixed hyperlipidemia: Secondary | ICD-10-CM | POA: Diagnosis not present

## 2021-04-05 DIAGNOSIS — R7301 Impaired fasting glucose: Secondary | ICD-10-CM | POA: Diagnosis not present

## 2021-05-27 DIAGNOSIS — J0100 Acute maxillary sinusitis, unspecified: Secondary | ICD-10-CM | POA: Diagnosis not present

## 2021-05-27 DIAGNOSIS — H9192 Unspecified hearing loss, left ear: Secondary | ICD-10-CM | POA: Diagnosis not present

## 2021-06-03 ENCOUNTER — Ambulatory Visit: Payer: Medicare HMO

## 2021-06-04 DIAGNOSIS — J111 Influenza due to unidentified influenza virus with other respiratory manifestations: Secondary | ICD-10-CM | POA: Diagnosis not present

## 2021-06-04 DIAGNOSIS — R059 Cough, unspecified: Secondary | ICD-10-CM | POA: Diagnosis not present

## 2021-06-04 DIAGNOSIS — J069 Acute upper respiratory infection, unspecified: Secondary | ICD-10-CM | POA: Diagnosis not present

## 2021-06-04 DIAGNOSIS — Z20822 Contact with and (suspected) exposure to covid-19: Secondary | ICD-10-CM | POA: Diagnosis not present

## 2021-08-09 DIAGNOSIS — Z8669 Personal history of other diseases of the nervous system and sense organs: Secondary | ICD-10-CM | POA: Diagnosis not present

## 2021-08-09 DIAGNOSIS — H2513 Age-related nuclear cataract, bilateral: Secondary | ICD-10-CM | POA: Diagnosis not present

## 2021-08-09 DIAGNOSIS — H524 Presbyopia: Secondary | ICD-10-CM | POA: Diagnosis not present

## 2021-08-09 DIAGNOSIS — H5203 Hypermetropia, bilateral: Secondary | ICD-10-CM | POA: Diagnosis not present

## 2021-08-18 DIAGNOSIS — H903 Sensorineural hearing loss, bilateral: Secondary | ICD-10-CM | POA: Diagnosis not present

## 2021-08-31 DIAGNOSIS — H938X3 Other specified disorders of ear, bilateral: Secondary | ICD-10-CM | POA: Diagnosis not present

## 2021-08-31 DIAGNOSIS — H903 Sensorineural hearing loss, bilateral: Secondary | ICD-10-CM | POA: Diagnosis not present

## 2021-09-01 ENCOUNTER — Other Ambulatory Visit (HOSPITAL_COMMUNITY): Payer: Self-pay | Admitting: Adult Health Nurse Practitioner

## 2021-09-01 DIAGNOSIS — Z1231 Encounter for screening mammogram for malignant neoplasm of breast: Secondary | ICD-10-CM

## 2021-09-06 ENCOUNTER — Ambulatory Visit (HOSPITAL_COMMUNITY)
Admission: RE | Admit: 2021-09-06 | Discharge: 2021-09-06 | Disposition: A | Discharge status: Home / Self Care | Payer: Medicare HMO | Source: Ambulatory Visit | Attending: Adult Health Nurse Practitioner | Admitting: Adult Health Nurse Practitioner

## 2021-09-06 ENCOUNTER — Other Ambulatory Visit: Payer: Self-pay

## 2021-09-06 DIAGNOSIS — Z1231 Encounter for screening mammogram for malignant neoplasm of breast: Secondary | ICD-10-CM | POA: Insufficient documentation

## 2022-03-10 DIAGNOSIS — H01001 Unspecified blepharitis right upper eyelid: Secondary | ICD-10-CM | POA: Diagnosis not present

## 2022-03-10 DIAGNOSIS — H01004 Unspecified blepharitis left upper eyelid: Secondary | ICD-10-CM | POA: Diagnosis not present

## 2022-03-25 ENCOUNTER — Ambulatory Visit (INDEPENDENT_AMBULATORY_CARE_PROVIDER_SITE_OTHER): Payer: Medicare HMO | Admitting: Family Medicine

## 2022-03-25 VITALS — BP 139/83 | HR 71 | Temp 97.5°F | Ht 66.0 in | Wt 172.0 lb

## 2022-03-25 DIAGNOSIS — G43909 Migraine, unspecified, not intractable, without status migrainosus: Secondary | ICD-10-CM | POA: Diagnosis not present

## 2022-03-25 DIAGNOSIS — G8929 Other chronic pain: Secondary | ICD-10-CM | POA: Diagnosis not present

## 2022-03-25 DIAGNOSIS — M25512 Pain in left shoulder: Secondary | ICD-10-CM | POA: Diagnosis not present

## 2022-03-25 DIAGNOSIS — E663 Overweight: Secondary | ICD-10-CM

## 2022-03-25 DIAGNOSIS — E782 Mixed hyperlipidemia: Secondary | ICD-10-CM | POA: Insufficient documentation

## 2022-03-25 DIAGNOSIS — Z130 Encounter for screening for diseases of the blood and blood-forming organs and certain disorders involving the immune mechanism: Secondary | ICD-10-CM | POA: Diagnosis not present

## 2022-03-25 MED ORDER — MELOXICAM 15 MG PO TABS: 15.0000 mg | ORAL_TABLET | Freq: Every day | ORAL | 0 refills | Status: AC

## 2022-03-25 MED ORDER — NURTEC 75 MG PO TBDP: 75.0000 mg | ORAL_TABLET | ORAL | 1 refills | Status: DC

## 2022-03-25 NOTE — Patient Instructions (Signed)
Xray at the hospital.  Labs when you can.  We will call with the results.  Medications as prescribed.

## 2022-03-27 DIAGNOSIS — G8929 Other chronic pain: Secondary | ICD-10-CM | POA: Insufficient documentation

## 2022-03-27 NOTE — Assessment & Plan Note (Signed)
Uncontrolled. Starting Nurtec for prophylaxis.

## 2022-03-27 NOTE — Progress Notes (Signed)
Subjective:  Patient ID: Melinda English, female    DOB: 1956-04-11  Age: 66 y.o. MRN: [id number]  CC: Chief Complaint  Patient presents with   Establish Care    Left shoulder pain unknown cause couple of months    HPI:  66 year old female presents to establish care.  Patient has a history of migraine headache.  She states that these occur quite frequently.  8-10 times a month.  She uses Imitrex regularly.  She is on no medication for prophylaxis at this time.  We will discuss today and the fact that she is having such frequent migraines.  Patient reports a 59-monthhistory of left shoulder pain.  Associated decreased range of motion.  No known inciting factor.  Relieving factors.    Patient Active Problem List   Diagnosis Date Noted   Chronic left shoulder pain 03/27/2022   Mixed hyperlipidemia 03/25/2022   Migraine headache 01/10/2017   Chronic GERD 01/10/2017   Venous insufficiency 01/10/2017    Social Hx   Social History   Socioeconomic History   Marital status: Married    Spouse name: Richard   Number of children: 2   Years of education: 12   Highest education level: Not on file  Occupational History   Occupation: tEstate agent   Comment: first nOffice manager Tobacco Use   Smoking status: Never   Smokeless tobacco: Never  Vaping Use   Vaping Use: Never used  Substance and Sexual Activity   Alcohol use: No   Drug use: No   Sexual activity: Yes    Birth control/protection: Post-menopausal, Surgical  Other Topics Concern   Not on file  Social History Narrative   Married, 2 daughters.   Educ: HS grad   Occupation: BSecretary/administratorat first [employer]   Social Determinants of Health   Financial Resource Strain: Not on file  Food Insecurity: Not on file  Transportation Needs: Not on file  Physical Activity: Not on file  Stress: Not on file  Social Connections: Not on file    Review of Systems Per HPI  Objective:  BP 139/83   Pulse 71    Temp (!) 97.5 F (36.4 C)   Ht _0  (1.676 m)   Wt 172 lb (78 kg)   SpO2 98%   BMI 27.76 kg/m      03/25/2022    2:05 PM 02/26/2021    5:21 PM 02/26/2021    4:13 PM  BP/Weight  Systolic BP [id number] Diastolic BP 83 73 73  Wt. (Lbs) 172    BMI 27.76 kg/m2      Physical Exam Vitals and nursing note reviewed.  Constitutional:      General: She is not in acute distress.    Appearance: Normal appearance.  HENT:     Head: Normocephalic and atraumatic.  Eyes:     General:        Right eye: No discharge.        Left eye: No discharge.     Conjunctiva/sclera: Conjunctivae normal.  Cardiovascular:     Rate and Rhythm: Normal rate and regular rhythm.  Pulmonary:     Effort: Pulmonary effort is normal.     Breath sounds: Normal breath sounds. No wheezing, rhonchi or rales.  Musculoskeletal:     Comments: Shoulder: Left Inspection reveals no abnormalities, atrophy or asymmetry. Palpation is normal with no tenderness over AC joint or bicipital groove. ROM is decreased (mostly with extension).  Rotator cuff strength normal throughout. + Hawkin's test.     Neurological:     Mental Status: She is alert.  Psychiatric:        Mood and Affect: Mood normal.        Behavior: Behavior normal.     Lab Results  Component Value Date   WBC 5.1 01/10/2017   HGB 15.0 01/10/2017   HCT 44.6 01/10/2017   PLT 249 01/10/2017   GLUCOSE 86 01/10/2017   CHOL 254 (H) 01/10/2017   TRIG 281 (H) 01/10/2017   HDL 42 (L) 01/10/2017   LDLDIRECT 115.0 12/28/2015   LDLCALC 156 (H) 01/10/2017   ALT 25 01/10/2017   AST 24 01/10/2017   NA 141 01/10/2017   K 4.5 01/10/2017   CL 102 01/10/2017   CREATININE 0.83 01/10/2017   BUN 18 01/10/2017   CO2 24 01/10/2017   TSH 2.41 01/10/2017     Assessment & Plan:   Problem List Items Addressed This Visit       Cardiovascular and Mediastinum   Migraine headache    Uncontrolled. Starting Nurtec for prophylaxis.      Relevant Medications    meloxicam (MOBIC) 15 MG tablet   Rimegepant Sulfate (NURTEC) 75 MG TBDP     Other   Chronic left shoulder pain - Primary    X-ray for further evaluation.  Meloxicam as directed.      Relevant Medications   meloxicam (MOBIC) 15 MG tablet   Other Relevant Orders   DG Shoulder Left   Mixed hyperlipidemia   Relevant Orders   Lipid panel   Other Visit Diagnoses     Screening for deficiency anemia       Relevant Orders   CBC   Overweight (BMI 25.0-29.9)       Relevant Orders   CMP14+EGFR       Meds ordered this encounter  Medications   meloxicam (MOBIC) 15 MG tablet    Sig: Take 1 tablet (15 mg total) by mouth daily.    Dispense:  30 tablet    Refill:  0   Rimegepant Sulfate (NURTEC) 75 MG TBDP    Sig: Take 75 mg by mouth every other day.    Dispense:  45 tablet    Refill:  1    Follow-up:  Return in about 1 year (around 03/26/2023), or if symptoms worsen or fail to improve.  [place]

## 2022-03-27 NOTE — Assessment & Plan Note (Signed)
X-ray for further evaluation.  Meloxicam as directed. 

## 2022-04-11 ENCOUNTER — Telehealth: Payer: Self-pay | Admitting: *Deleted

## 2022-04-11 NOTE — Telephone Encounter (Signed)
PA for Nurtec approved by insurance. Approval good through 06/12/22  Left message to return call

## 2022-04-12 NOTE — Telephone Encounter (Signed)
Patient notified

## 2022-04-13 ENCOUNTER — Telehealth: Payer: Self-pay | Admitting: *Deleted

## 2022-04-13 MED ORDER — NURTEC 75 MG PO TBDP: 75.0000 mg | ORAL_TABLET | ORAL | 1 refills | Status: DC

## 2022-04-13 NOTE — Telephone Encounter (Signed)
Coral Spikes, DO     Will you send in new Rx for me please?   Thank you   Dr. Lacinda Axon

## 2022-04-13 NOTE — Telephone Encounter (Signed)
Per insurance Nurtec will only covered in a script for a 30 day supply whereas the script does not exceed 16 tablets in a 30 day period- will not cover 90 day supply or quantities greater than 16

## 2022-04-13 NOTE — Telephone Encounter (Signed)
Prescription sent electronically to pharmacy. 

## 2022-04-25 ENCOUNTER — Encounter (INDEPENDENT_AMBULATORY_CARE_PROVIDER_SITE_OTHER): Payer: Self-pay | Admitting: Gastroenterology

## 2022-05-13 DIAGNOSIS — E663 Overweight: Secondary | ICD-10-CM | POA: Diagnosis not present

## 2022-05-13 DIAGNOSIS — E782 Mixed hyperlipidemia: Secondary | ICD-10-CM | POA: Diagnosis not present

## 2022-05-13 DIAGNOSIS — Z130 Encounter for screening for diseases of the blood and blood-forming organs and certain disorders involving the immune mechanism: Secondary | ICD-10-CM | POA: Diagnosis not present

## 2022-05-14 LAB — CMP14+EGFR
ALT: 20 IU/L (ref 0–32)
AST: 23 IU/L (ref 0–40)
Albumin/Globulin Ratio: 2.2 (ref 1.2–2.2)
Albumin: 4.8 g/dL (ref 3.9–4.9)
Alkaline Phosphatase: 67 IU/L (ref 44–121)
BUN/Creatinine Ratio: 18 (ref 12–28)
BUN: 18 mg/dL (ref 8–27)
Bilirubin Total: 1.2 mg/dL (ref 0.0–1.2)
CO2: 23 mmol/L (ref 20–29)
Calcium: 9.6 mg/dL (ref 8.7–10.3)
Chloride: 101 mmol/L (ref 96–106)
Creatinine, Ser: 1 mg/dL (ref 0.57–1.00)
Globulin, Total: 2.2 g/dL (ref 1.5–4.5)
Glucose: 100 mg/dL — ABNORMAL HIGH (ref 70–99)
Potassium: 4.5 mmol/L (ref 3.5–5.2)
Sodium: 140 mmol/L (ref 134–144)
Total Protein: 7 g/dL (ref 6.0–8.5)
eGFR: 62 mL/min/{1.73_m2} (ref >59)

## 2022-05-14 LAB — CBC
Hematocrit: 43.7 % (ref 34.0–46.6)
Hemoglobin: 14.9 g/dL (ref 11.1–15.9)
MCH: 29.9 pg (ref 26.6–33.0)
MCHC: 34.1 g/dL (ref 31.5–35.7)
MCV: 88 fL (ref 79–97)
Platelets: 229 10*3/uL (ref 150–450)
RBC: 4.99 x10E6/uL (ref 3.77–5.28)
RDW: 12.3 % (ref 11.7–15.4)
WBC: 4 10*3/uL (ref 3.4–10.8)

## 2022-05-14 LAB — LIPID PANEL
Chol/HDL Ratio: 4.4 ratio (ref 0.0–4.4)
Cholesterol, Total: 209 mg/dL — ABNORMAL HIGH (ref 100–199)
HDL: 47 mg/dL (ref >39)
LDL Chol Calc (NIH): 139 mg/dL — ABNORMAL HIGH (ref 0–99)
Triglycerides: 128 mg/dL (ref 0–149)
VLDL Cholesterol Cal: 23 mg/dL (ref 5–40)

## 2022-06-10 ENCOUNTER — Ambulatory Visit (HOSPITAL_COMMUNITY)
Admission: RE | Admit: 2022-06-10 | Discharge: 2022-06-10 | Disposition: A | Discharge status: Home / Self Care | Payer: Medicare HMO | Source: Ambulatory Visit | Attending: Family Medicine | Admitting: Family Medicine

## 2022-06-10 DIAGNOSIS — M25512 Pain in left shoulder: Secondary | ICD-10-CM | POA: Diagnosis not present

## 2022-06-10 DIAGNOSIS — G8929 Other chronic pain: Secondary | ICD-10-CM | POA: Diagnosis not present

## 2022-06-10 DIAGNOSIS — M19012 Primary osteoarthritis, left shoulder: Secondary | ICD-10-CM | POA: Diagnosis not present

## 2022-06-17 ENCOUNTER — Ambulatory Visit (INDEPENDENT_AMBULATORY_CARE_PROVIDER_SITE_OTHER): Payer: Medicare HMO | Admitting: Family Medicine

## 2022-06-17 ENCOUNTER — Encounter: Payer: Self-pay | Admitting: Family Medicine

## 2022-06-17 VITALS — BP 124/70 | HR 75 | Temp 98.3°F | Ht 65.0 in | Wt 172.2 lb

## 2022-06-17 DIAGNOSIS — Z23 Encounter for immunization: Secondary | ICD-10-CM | POA: Diagnosis not present

## 2022-06-17 DIAGNOSIS — Z0000 Encounter for general adult medical examination without abnormal findings: Secondary | ICD-10-CM

## 2022-06-17 MED ORDER — NURTEC 75 MG PO TBDP: 75.0000 mg | ORAL_TABLET | ORAL | 6 refills | Status: DC

## 2022-06-17 NOTE — Progress Notes (Signed)
Subjective:   Melinda English is a 67 y.o. female who presents for Medicare Annual (Subsequent) preventive examination.  She is doing well. Has some sinus pressure since yesterday. Otherwise feeling well.   Review of Systems    No fever. Shoulder pain (chronic). Sinus pressure.        Objective:    Today's Vitals   06/17/22 0904  BP: 124/70  Pulse: 75  Temp: 98.3 F (36.8 C)  SpO2: 100%  Weight: 172 lb 3.2 oz (78.1 kg)  Height: 5\' 5"  (1.651 m)   Body mass index is 28.66 kg/m.     02/26/2021    4:12 PM 07/20/2018   10:06 AM 07/12/2018    1:49 PM 05/10/2017   11:49 AM 02/14/2017    5:30 PM 01/25/2017    5:28 PM 01/18/2017    5:48 PM  Advanced Directives  Does Patient Have a Medical Advance Directive? Yes Yes Yes Yes Yes Yes Yes  Type of Advance Directive Living will [place];Living will   [place];Living will [place];Living will [place];Living will  Does patient want to make changes to medical advance directive?  No - Patient declined       Copy of [place] in Chart?  No - copy requested   No - copy requested No - copy requested No - copy requested    Current Medications (verified) Outpatient Encounter Medications as of 06/17/2022  Medication Sig   Ascorbic Acid (VITAMIN C) 1000 MG tablet Take 1,000 mg by mouth daily.   CALCIUM-MAGNESIUM-ZINC PO Take 1 tablet by mouth daily.   co-enzyme Q-10 30 MG capsule Take 30 mg by mouth 3 (three) times daily.   loratadine (CLARITIN) 10 MG tablet Take 10 mg by mouth daily.   meloxicam (MOBIC) 15 MG tablet Take 1 tablet (15 mg total) by mouth daily.   Misc Natural Products (OSTEO BI-FLEX JOINT SHIELD PO) Take 1 tablet by mouth daily.    NON FORMULARY as needed. Bio E with Selenium   OVER THE COUNTER MEDICATION Pro omega ldl   pantoprazole (PROTONIX) 40 MG tablet Take 40 mg by mouth daily.   Riboflavin (B2) 100 MG TABS Take 1  tablet by mouth daily.   Rimegepant Sulfate (NURTEC) 75 MG TBDP Take 75 mg by mouth every other day.   SUMAtriptan (IMITREX) 100 MG tablet TAKE 1 TABLET BY MOUTH DAILY AS NEEDED FOR MIGRAINE. MAY REPEAT IN 2 HOURS IF HEADACHE PERSISTS   VITAMIN D PO Take by mouth. 5000   VITAMIN E PO Take 1 tablet by mouth daily as needed.   No facility-administered encounter medications on file as of 06/17/2022.    Allergies (verified) Patient has no known allergies.   History: Past Medical History:  Diagnosis Date   Abnormal mammogram of left breast 07/20/2018   Chronic rhinitis    Allergy testing 2018: positives to weeds,trees, one mold, and dust mites.   Chronic venous hypertension w/o comp of bilateral low extrm 09/2016   Vein/vascular clinic in [address]   Family history of abdominal aortic aneurysm 01/2015   Screening aortic u/s NEG   Glaucoma    History of hypothyroidism    was on armour thyroid 60mg  qd at one point--then stopped taking med old records don't have any f/u thyroid labs after this   History of narrow angle glaucoma    angle narrowing   Migraines    Mitral valve prolapse  with trace regurg per old records   PONV (postoperative nausea and vomiting)    Pruritus 07/2016   with erythema: rx'd high dose antihistamines bid by allergist   [place], normal    Urine incontinence    Venous stasis of lower extremity 09/2016   Right   Past Surgical History:  Procedure Laterality Date   ABDOMINAL HYSTERECTOMY  2004   with unilateral salpingooopherectomy (endometriosis)   BREAST LUMPECTOMY WITH RADIOACTIVE SEED LOCALIZATION Left 07/20/2018   Procedure: LEFT BREAST LUMPECTOMY WITH RADIOACTIVE SEED LOCALIZATION;  Surgeon: Claud Kelp, MD;  Location: [facility];  Service: General;  Laterality: Left;   CHOLECYSTECTOMY  2004   COLONOSCOPY  10/2002   Normal   COLONOSCOPY N/A 05/10/2017   Procedure: COLONOSCOPY;  Surgeon: Malissa Hippo, MD;  [facility];  Service: Endoscopy;  Laterality: N/A;  1:00   ESOPHAGEAL DILATION N/A 05/10/2017   Procedure: ESOPHAGEAL DILATION;  Surgeon: Malissa Hippo, MD;  [facility];  Service: Endoscopy;  Laterality: N/A;   ESOPHAGOGASTRODUODENOSCOPY N/A 05/10/2017   Procedure: ESOPHAGOGASTRODUODENOSCOPY (EGD);  Surgeon: Malissa Hippo, MD;  [facility];  Service: Endoscopy;  Laterality: N/A;   EYE SURGERY Bilateral 2004   for narrow angle glaucoma--laser    INCONTINENCE SURGERY     OOPHORECTOMY Right 1988   dermoid cyst   PARTIAL COLECTOMY  10/2002   Segmental resection of sigmoid.  Endometriosis was on colon as well--approx 6 inches.   Family History  Problem Relation Age of Onset   Arthritis Mother    Heart disease Father    Atrial fibrillation Father    Diabetes Father    AAA (abdominal aortic aneurysm) Father    Cancer Father        pancoast tumor   Breast cancer Sister    Cancer Sister    Colon cancer Maternal Uncle    Colon cancer Maternal Uncle    Diabetes Daughter    Allergic rhinitis Neg Hx    Asthma Neg Hx    Atopy Neg Hx    Angioedema Neg Hx    Eczema Neg Hx    Immunodeficiency Neg Hx    Urticaria Neg Hx    Social History   Socioeconomic History   Marital status: Married    Spouse name: Richard   Number of children: 2   Years of education: 12   Highest education level: Not on file  Occupational History   Occupation: Engineer, materials    Comment: [employer]  Tobacco Use   Smoking status: Never   Smokeless tobacco: Never  Vaping Use   Vaping Use: Never used  Substance and Sexual Activity   Alcohol use: No   Drug use: No   Sexual activity: Yes    Birth control/protection: Post-menopausal, Surgical  Other Topics Concern   Not on file  Social History Narrative   Married, 2 daughters.   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   Social Determinants of Health   Financial Resource Strain: Not on file  Food  Insecurity: Not on file  Transportation Needs: Not on file  Physical Activity: Not on file  Stress: Not on file  Social Connections: Not on file    Tobacco Counseling Non smoker.   Activities of Daily Living No difficulties with activities of daily living.  Patient Care Team: Tommie Sams, DO as PCP - General (Family Medicine) Alfonse Spruce, MD as Consulting  Physician (Allergy and Immunology) Elza Rafter, MD as Consulting Physician (Family Medicine)     Assessment:   This is a routine wellness examination for Melinda English.  Hearing/Vision screen Has had prior hearing screen (normal). No vision concerns.    Depression Screen    06/17/2022    9:06 AM 03/25/2022    2:08 PM 01/10/2017    8:18 AM 05/25/2015    2:18 PM  PHQ 2/9 Scores  PHQ - 2 Score 0 0 0 0  Exception Documentation    Other- indicate reason in comment box    Fall Risk    06/17/2022    9:06 AM 03/25/2022    2:08 PM 01/10/2017    8:18 AM 05/25/2015    2:18 PM  Fall Risk   Falls in the past year? 0 0 No No  Number falls in past yr: 0 0    Injury with Fall? 0 0    Risk for fall due to : No Fall Risks No Fall Risks  Other (Comment)  Follow up Falls evaluation completed Falls evaluation completed      [place]: Patient is not a high risk for falls.  No indications for tools for fall prevention.  TIMED UP AND GO:  Was the test performed? No . Not needed.  Cognitive Function:  Normal. Passed Mini Cog.     Immunizations Immunization History  Administered Date(s) Administered   Tdap 06/13/2010    TDAP status: Due, Education has been provided regarding the importance of this vaccine. Advised may receive this vaccine at local pharmacy or Health Dept. Aware to provide a copy of the vaccination record if obtained from local pharmacy or Health Dept. Verbalized acceptance and understanding.  Flu Vaccine status: Declined, Education has been provided regarding the  importance of this vaccine but patient still declined. Advised may receive this vaccine at local pharmacy or Health Dept. Aware to provide a copy of the vaccination record if obtained from local pharmacy or Health Dept. Verbalized acceptance and understanding.  Pneumococcal vaccine status: Completed during today's visit.  COVID Vaccine - Declined.   Shingrix has been completed.   Screening Tests Health Maintenance  Topic Date Due   DTaP/Tdap/Td (2 - Td or Tdap) 06/13/2020   Medicare Annual Wellness (AWV)  04/05/2022   INFLUENZA VACCINE  09/11/2022 (Originally 01/11/2022)   Pneumonia Vaccine 80+ Years old (1 - PCV) 03/28/2023 (Originally 08/16/2020)   MAMMOGRAM  09/07/2023   COLONOSCOPY (Pts 45-15yrs Insurance coverage will need to be confirmed)  05/11/2027   DEXA SCAN  Completed   Hepatitis C Screening  Completed   Zoster Vaccines- Shingrix  Completed   HPV VACCINES  Aged Out   COVID-19 Vaccine  Discontinued    Health Maintenance  Health Maintenance Due  Topic Date Due   DTaP/Tdap/Td (2 - Td or Tdap) 06/13/2020   Medicare Annual Wellness (AWV)  04/05/2022    Colon cancer screening up-to-date.  Mammogram up-to-date.  Previous normal bone density test in 2019.  Additional Screening:  Hepatitis C Screening: Completed.   Vision Screening: Recommended annual ophthalmology exams for early detection of glaucoma and other disorders of the eye.  Dental Screening: Recommended annual dental exams for proper oral hygiene   Plan:     I have personally reviewed and noted the following in the patient's chart:   Medical and social history Use of alcohol, tobacco or illicit drugs  Current medications and supplements including opioid prescriptions. Functional ability and status Nutritional  status Physical activity Advanced directives List of other physicians Hospitalizations, surgeries, and ER visits in previous 12 months Vitals Screenings to include cognitive, depression, and  falls Referrals and appointments  In addition, I have reviewed and discussed with patient certain preventive protocols, quality metrics, and best practice recommendations. A written personalized care plan for preventive services as well as general preventive health recommendations were provided to patient.    Tommie Sams, DO   06/17/2022

## 2022-06-27 DIAGNOSIS — X32XXXD Exposure to sunlight, subsequent encounter: Secondary | ICD-10-CM | POA: Diagnosis not present

## 2022-06-27 DIAGNOSIS — Z1283 Encounter for screening for malignant neoplasm of skin: Secondary | ICD-10-CM | POA: Diagnosis not present

## 2022-06-27 DIAGNOSIS — L570 Actinic keratosis: Secondary | ICD-10-CM | POA: Diagnosis not present

## 2022-06-27 DIAGNOSIS — D225 Melanocytic nevi of trunk: Secondary | ICD-10-CM | POA: Diagnosis not present

## 2022-09-05 DIAGNOSIS — H5203 Hypermetropia, bilateral: Secondary | ICD-10-CM | POA: Diagnosis not present

## 2022-09-05 DIAGNOSIS — H524 Presbyopia: Secondary | ICD-10-CM | POA: Diagnosis not present

## 2022-09-09 DIAGNOSIS — Z0100 Encounter for examination of eyes and vision without abnormal findings: Secondary | ICD-10-CM | POA: Diagnosis not present

## 2022-10-14 DIAGNOSIS — Z1330 Encounter for screening examination for mental health and behavioral disorders, unspecified: Secondary | ICD-10-CM | POA: Diagnosis not present

## 2022-10-14 DIAGNOSIS — L720 Epidermal cyst: Secondary | ICD-10-CM | POA: Diagnosis not present

## 2022-10-14 DIAGNOSIS — K219 Gastro-esophageal reflux disease without esophagitis: Secondary | ICD-10-CM | POA: Diagnosis not present

## 2022-10-14 DIAGNOSIS — G43019 Migraine without aura, intractable, without status migrainosus: Secondary | ICD-10-CM | POA: Diagnosis not present

## 2022-10-17 ENCOUNTER — Other Ambulatory Visit (HOSPITAL_COMMUNITY): Payer: Self-pay | Admitting: Adult Health Nurse Practitioner

## 2022-10-17 DIAGNOSIS — Z1231 Encounter for screening mammogram for malignant neoplasm of breast: Secondary | ICD-10-CM

## 2022-10-21 ENCOUNTER — Ambulatory Visit (HOSPITAL_COMMUNITY)
Admission: RE | Admit: 2022-10-21 | Discharge: 2022-10-21 | Disposition: A | Discharge status: Home / Self Care | Payer: Medicare HMO | Source: Ambulatory Visit | Attending: Adult Health Nurse Practitioner | Admitting: Adult Health Nurse Practitioner

## 2022-10-21 ENCOUNTER — Encounter (HOSPITAL_COMMUNITY): Payer: Self-pay

## 2022-10-21 DIAGNOSIS — Z1231 Encounter for screening mammogram for malignant neoplasm of breast: Secondary | ICD-10-CM | POA: Insufficient documentation

## 2022-11-03 DIAGNOSIS — H61001 Unspecified perichondritis of right external ear: Secondary | ICD-10-CM | POA: Diagnosis not present

## 2023-07-31 ENCOUNTER — Telehealth: Payer: Self-pay | Admitting: Family Medicine

## 2023-07-31 NOTE — Telephone Encounter (Signed)
Spoke with patient to schedule her AWVS - she asked for someone to contact her about getting her lab work done.   Thank you,  Judeth Cornfield,  AMB Clinical Support [facility] AWV Program Direct Dial ??[phone]

## 2023-08-02 ENCOUNTER — Other Ambulatory Visit: Payer: Self-pay | Admitting: Family Medicine

## 2023-08-02 ENCOUNTER — Encounter: Payer: Self-pay | Admitting: Family Medicine

## 2023-08-02 ENCOUNTER — Other Ambulatory Visit: Payer: Self-pay

## 2023-08-02 DIAGNOSIS — Z130 Encounter for screening for diseases of the blood and blood-forming organs and certain disorders involving the immune mechanism: Secondary | ICD-10-CM

## 2023-08-02 DIAGNOSIS — R7309 Other abnormal glucose: Secondary | ICD-10-CM

## 2023-08-02 DIAGNOSIS — E782 Mixed hyperlipidemia: Secondary | ICD-10-CM

## 2023-08-02 NOTE — Telephone Encounter (Unsigned)
Copied from CRM [id number]. Topic: General - Other >> Aug 02, 2023 12:18 PM Priscille Loveless wrote: Reason for CRM: Pt wants to know where she can go to get those labs done with the order that was put in. Please call her back.

## 2023-08-11 DIAGNOSIS — E782 Mixed hyperlipidemia: Secondary | ICD-10-CM | POA: Diagnosis not present

## 2023-08-11 DIAGNOSIS — R7309 Other abnormal glucose: Secondary | ICD-10-CM | POA: Diagnosis not present

## 2023-08-11 DIAGNOSIS — Z130 Encounter for screening for diseases of the blood and blood-forming organs and certain disorders involving the immune mechanism: Secondary | ICD-10-CM | POA: Diagnosis not present

## 2023-08-12 LAB — CMP14+EGFR
ALT: 21 IU/L (ref 0–32)
AST: 21 IU/L (ref 0–40)
Albumin: 4.3 g/dL (ref 3.9–4.9)
Alkaline Phosphatase: 65 IU/L (ref 44–121)
BUN/Creatinine Ratio: 18 (ref 12–28)
BUN: 21 mg/dL (ref 8–27)
Bilirubin Total: 1.7 mg/dL — ABNORMAL HIGH (ref 0.0–1.2)
CO2: 23 mmol/L (ref 20–29)
Calcium: 9.6 mg/dL (ref 8.7–10.3)
Chloride: 101 mmol/L (ref 96–106)
Creatinine, Ser: 1.17 mg/dL — ABNORMAL HIGH (ref 0.57–1.00)
Globulin, Total: 2.6 g/dL (ref 1.5–4.5)
Glucose: 92 mg/dL (ref 70–99)
Potassium: 4.4 mmol/L (ref 3.5–5.2)
Sodium: 140 mmol/L (ref 134–144)
Total Protein: 6.9 g/dL (ref 6.0–8.5)
eGFR: 51 mL/min/{1.73_m2} — ABNORMAL LOW (ref >59)

## 2023-08-12 LAB — CBC
Hematocrit: 44.1 % (ref 34.0–46.6)
Hemoglobin: 14.7 g/dL (ref 11.1–15.9)
MCH: 30.4 pg (ref 26.6–33.0)
MCHC: 33.3 g/dL (ref 31.5–35.7)
MCV: 91 fL (ref 79–97)
Platelets: 227 10*3/uL (ref 150–450)
RBC: 4.84 x10E6/uL (ref 3.77–5.28)
RDW: 12.6 % (ref 11.7–15.4)
WBC: 4.5 10*3/uL (ref 3.4–10.8)

## 2023-08-12 LAB — LIPID PANEL
Chol/HDL Ratio: 4.9 ratio — ABNORMAL HIGH (ref 0.0–4.4)
Cholesterol, Total: 224 mg/dL — ABNORMAL HIGH (ref 100–199)
HDL: 46 mg/dL (ref >39)
LDL Chol Calc (NIH): 149 mg/dL — ABNORMAL HIGH (ref 0–99)
Triglycerides: 161 mg/dL — ABNORMAL HIGH (ref 0–149)
VLDL Cholesterol Cal: 29 mg/dL (ref 5–40)

## 2023-08-13 ENCOUNTER — Encounter: Payer: Self-pay | Admitting: Family Medicine

## 2023-08-14 ENCOUNTER — Other Ambulatory Visit: Payer: Self-pay

## 2023-08-14 DIAGNOSIS — E782 Mixed hyperlipidemia: Secondary | ICD-10-CM

## 2023-08-14 DIAGNOSIS — R7989 Other specified abnormal findings of blood chemistry: Secondary | ICD-10-CM

## 2023-09-04 DIAGNOSIS — X32XXXD Exposure to sunlight, subsequent encounter: Secondary | ICD-10-CM | POA: Diagnosis not present

## 2023-09-04 DIAGNOSIS — R208 Other disturbances of skin sensation: Secondary | ICD-10-CM | POA: Diagnosis not present

## 2023-09-04 DIAGNOSIS — D225 Melanocytic nevi of trunk: Secondary | ICD-10-CM | POA: Diagnosis not present

## 2023-09-04 DIAGNOSIS — Z1283 Encounter for screening for malignant neoplasm of skin: Secondary | ICD-10-CM | POA: Diagnosis not present

## 2023-09-04 DIAGNOSIS — L570 Actinic keratosis: Secondary | ICD-10-CM | POA: Diagnosis not present

## 2023-09-15 DIAGNOSIS — E782 Mixed hyperlipidemia: Secondary | ICD-10-CM | POA: Diagnosis not present

## 2023-09-15 DIAGNOSIS — R7989 Other specified abnormal findings of blood chemistry: Secondary | ICD-10-CM | POA: Diagnosis not present

## 2023-09-16 LAB — LIPID PANEL
Chol/HDL Ratio: 5.2 ratio — ABNORMAL HIGH (ref 0.0–4.4)
Cholesterol, Total: 213 mg/dL — ABNORMAL HIGH (ref 100–199)
HDL: 41 mg/dL (ref >39)
LDL Chol Calc (NIH): 144 mg/dL — ABNORMAL HIGH (ref 0–99)
Triglycerides: 152 mg/dL — ABNORMAL HIGH (ref 0–149)
VLDL Cholesterol Cal: 28 mg/dL (ref 5–40)

## 2023-09-16 LAB — CREATININE, SERUM
Creatinine, Ser: 0.93 mg/dL (ref 0.57–1.00)
eGFR: 67 mL/min/{1.73_m2} (ref >59)

## 2023-09-17 ENCOUNTER — Encounter: Payer: Self-pay | Admitting: Family Medicine

## 2023-09-20 ENCOUNTER — Other Ambulatory Visit: Payer: Self-pay | Admitting: Family Medicine

## 2023-09-20 DIAGNOSIS — R17 Unspecified jaundice: Secondary | ICD-10-CM

## 2023-10-06 DIAGNOSIS — R17 Unspecified jaundice: Secondary | ICD-10-CM | POA: Diagnosis not present

## 2023-10-07 LAB — COMPREHENSIVE METABOLIC PANEL WITH GFR
ALT: 21 IU/L (ref 0–32)
AST: 22 IU/L (ref 0–40)
Albumin: 4.5 g/dL (ref 3.9–4.9)
Alkaline Phosphatase: 73 IU/L (ref 44–121)
BUN/Creatinine Ratio: 18 (ref 12–28)
BUN: 18 mg/dL (ref 8–27)
Bilirubin Total: 1.2 mg/dL (ref 0.0–1.2)
CO2: 22 mmol/L (ref 20–29)
Calcium: 9.6 mg/dL (ref 8.7–10.3)
Chloride: 103 mmol/L (ref 96–106)
Creatinine, Ser: 0.99 mg/dL (ref 0.57–1.00)
Globulin, Total: 2.3 g/dL (ref 1.5–4.5)
Glucose: 90 mg/dL (ref 70–99)
Potassium: 5.1 mmol/L (ref 3.5–5.2)
Sodium: 141 mmol/L (ref 134–144)
Total Protein: 6.8 g/dL (ref 6.0–8.5)
eGFR: 62 mL/min/{1.73_m2} (ref >59)

## 2023-10-08 ENCOUNTER — Encounter: Payer: Self-pay | Admitting: Family Medicine

## 2023-10-12 ENCOUNTER — Encounter: Payer: Self-pay | Admitting: Family Medicine

## 2023-10-20 ENCOUNTER — Encounter (HOSPITAL_COMMUNITY): Payer: Self-pay

## 2023-10-27 ENCOUNTER — Other Ambulatory Visit: Payer: Self-pay

## 2023-10-27 ENCOUNTER — Ambulatory Visit: Payer: Medicare HMO

## 2023-10-27 VITALS — Ht 65.0 in | Wt 172.0 lb

## 2023-10-27 DIAGNOSIS — Z1231 Encounter for screening mammogram for malignant neoplasm of breast: Secondary | ICD-10-CM

## 2023-10-27 DIAGNOSIS — Z780 Asymptomatic menopausal state: Secondary | ICD-10-CM

## 2023-10-27 DIAGNOSIS — Z0000 Encounter for general adult medical examination without abnormal findings: Secondary | ICD-10-CM | POA: Diagnosis not present

## 2023-10-27 NOTE — Telephone Encounter (Signed)
 Patient seen for AWV and is asking for a refill of Nurtec.  Not seen since 06/17/22.  Would like to schedule annual physical but no openings noted this calendar year.  Please advise.

## 2023-10-27 NOTE — Patient Instructions (Signed)
 Melinda English , Thank you for taking time out of your busy schedule to complete your Annual Wellness Visit with me. I enjoyed our conversation and look forward to speaking with you again next year. I, as well as your care team,  appreciate your ongoing commitment to your health goals. Please review the following plan we discussed and let me know if I can assist you in the future. Your Game plan/ To Do List    Referrals:  You have an order for:  []   2D Mammogram  [x]   3D Mammogram  [x]   Bone Density     Please call for appointment:   [facility] at [facility] [address] [place], [place] [address] [phone]  Make sure to wear two-piece clothing.  No lotions, powders, or deodorants the day of the appointment. Make sure to bring picture ID and insurance card.  Bring list of medications you are currently taking including any supplements.   Follow up Visits: Next Medicare AWV with our clinical staff: In 1 year    Have you seen your provider in the last 6 months (3 months if uncontrolled diabetes)? No Next Office Visit with your provider: Office will contact you to schedule   Clinician Recommendations:  Aim for 30 minutes of exercise or brisk walking, 6-8 glasses of water , and 5 servings of fruits and vegetables each day.       This is a list of the screening recommended for you and due dates:  Health Maintenance  Topic Date Due   DTaP/Tdap/Td vaccine (2 - Td or Tdap) 06/13/2020   Flu Shot  01/12/2024   Mammogram  10/20/2024   Medicare Annual Wellness Visit  10/26/2024   Colon Cancer Screening  05/11/2027   Pneumonia Vaccine  Completed   DEXA scan (bone density measurement)  Completed   Hepatitis C Screening  Completed   Zoster (Shingles) Vaccine  Completed   HPV Vaccine  Aged Out   Meningitis B Vaccine  Aged Out   COVID-19 Vaccine  Discontinued    Advanced directives: (ACP Link)Information on Advanced Care Planning can be found at [place]   Secretary of [facility] Advance Health Care Directives Advance Health Care Directives. [email or web address]   Advance Care Planning is important because it:  [x]  Makes sure you receive the medical care that is consistent with your values, goals, and preferences  [x]  It provides guidance to your family and loved ones and reduces their decisional burden about whether or not they are making the right decisions based on your wishes.  Follow the link provided in your after visit summary or read over the paperwork we have mailed to you to help you started getting your Advance Directives in place. If you need assistance in completing these, please reach out to us  so that we can help you!  See attachments for Preventive Care and Fall Prevention Tips.

## 2023-10-27 NOTE — Addendum Note (Signed)
 Addended by: Seabron Cypress B on: 10/27/2023 12:10 PM   Modules accepted: Orders

## 2023-10-27 NOTE — Progress Notes (Signed)
 Subjective:   Melinda English is a 68 y.o. who presents for a Medicare Wellness preventive visit.  As a reminder, Annual Wellness Visits don't include a physical exam, and some assessments may be limited, especially if this visit is performed virtually. We may recommend an in-person follow-up visit with your provider if needed.  Visit Complete: Virtual I connected with  Ramon Burn on 10/27/23 by a audio enabled telemedicine application and verified that I am speaking with the correct person using two identifiers.  Patient Location: Home  Provider Location: Home Office  I discussed the limitations of evaluation and management by telemedicine. The patient expressed understanding and agreed to proceed.  Vital Signs: Because this visit was a virtual/telehealth visit, some criteria may be missing or patient reported. Any vitals not documented were not able to be obtained and vitals that have been documented are patient reported.  VideoDeclined- This patient declined Librarian, academic. Therefore the visit was completed with audio only.  Persons Participating in Visit: Patient.  AWV Questionnaire: No: Patient Medicare AWV questionnaire was not completed prior to this visit.  Cardiac Risk Factors include: advanced age (>55men, >11 women);dyslipidemia     Objective:     Today's Vitals   10/27/23 1127  Weight: 172 lb (78 kg)  Height: 5\' 5"  (1.651 m)   Body mass index is 28.62 kg/m.     10/27/2023   11:32 AM 02/26/2021    4:12 PM 07/20/2018   10:06 AM 07/12/2018    1:49 PM 05/10/2017   11:49 AM 02/14/2017    5:30 PM 01/25/2017    5:28 PM  Advanced Directives  Does Patient Have a Medical Advance Directive? No Yes Yes Yes Yes Yes Yes  Type of Advance Directive  Living will Healthcare Power of [place];Living will   Healthcare Power of [place];Living will Healthcare Power of [place];Living will  Does patient want to make changes to  medical advance directive?   No - Patient declined      Copy of Healthcare Power of Attorney in Chart?   No - copy requested   No - copy requested No - copy requested  Would patient like information on creating a medical advance directive? Yes (MAU/Ambulatory/Procedural Areas - Information given)          Current Medications (verified) Outpatient Encounter Medications as of 10/27/2023  Medication Sig   Ascorbic Acid (VITAMIN C) 1000 MG tablet Take 1,000 mg by mouth daily.   CALCIUM-MAGNESIUM-ZINC PO Take 1 tablet by mouth daily.   co-enzyme Q-10 30 MG capsule Take 30 mg by mouth 3 (three) times daily.   loratadine (CLARITIN) 10 MG tablet Take 10 mg by mouth daily.   meloxicam  (MOBIC ) 15 MG tablet Take 1 tablet (15 mg total) by mouth daily.   Misc Natural Products (OSTEO BI-FLEX JOINT SHIELD PO) Take 1 tablet by mouth daily.    NON FORMULARY as needed. Bio E with Selenium   OVER THE COUNTER MEDICATION Pro omega ldl   pantoprazole  (PROTONIX ) 40 MG tablet Take 40 mg by mouth daily.   Riboflavin (B2) 100 MG TABS Take 1 tablet by mouth daily.   Rimegepant Sulfate (NURTEC) 75 MG TBDP Take 75 mg by mouth every other day.   SUMAtriptan  (IMITREX ) 100 MG tablet TAKE 1 TABLET BY MOUTH DAILY AS NEEDED FOR MIGRAINE. MAY REPEAT IN 2 HOURS IF HEADACHE PERSISTS   VITAMIN D  PO Take by mouth. 5000   VITAMIN E PO Take 1 tablet by mouth  daily as needed.   No facility-administered encounter medications on file as of 10/27/2023.    Allergies (verified) Patient has no known allergies.   History: Past Medical History:  Diagnosis Date   Abnormal mammogram of left breast 07/20/2018   Chronic rhinitis    Allergy testing 2018: positives to weeds,trees, one mold, and dust mites.   Chronic venous hypertension w/o comp of bilateral low extrm 09/2016   Vein/vascular clinic in [address]   Family history of abdominal aortic aneurysm 01/2015   Screening aortic u/s NEG   Glaucoma    History of hypothyroidism     was on armour thyroid  60mg  qd at one point--then stopped taking med old records don't have any f/u thyroid  labs after this   History of narrow angle glaucoma    angle narrowing   Migraines    Mitral valve prolapse    with trace regurg per old records   PONV (postoperative nausea and vomiting)    Pruritus 07/2016   with erythema: rx'd high dose antihistamines bid by allergist   [place], normal    Urine incontinence    Venous stasis of lower extremity 09/2016   Right   Past Surgical History:  Procedure Laterality Date   ABDOMINAL HYSTERECTOMY  2004   with unilateral salpingooopherectomy (endometriosis)   BREAST LUMPECTOMY WITH RADIOACTIVE SEED LOCALIZATION Left 07/20/2018   COMPLEX SCLEROSING LESION WITH USUAL DUCTAL HYPERPLASIA AND CALCIFICATIONS   CHOLECYSTECTOMY  2004   COLONOSCOPY  10/2002   Normal   COLONOSCOPY N/A 05/10/2017   Procedure: COLONOSCOPY;  Surgeon: Ruby Corporal, MD;  [facility];  Service: Endoscopy;  Laterality: N/A;  1:00   ESOPHAGEAL DILATION N/A 05/10/2017   Procedure: ESOPHAGEAL DILATION;  Surgeon: Ruby Corporal, MD;  [facility];  Service: Endoscopy;  Laterality: N/A;   ESOPHAGOGASTRODUODENOSCOPY N/A 05/10/2017   Procedure: ESOPHAGOGASTRODUODENOSCOPY (EGD);  Surgeon: Ruby Corporal, MD;  [facility];  Service: Endoscopy;  Laterality: N/A;   EYE SURGERY Bilateral 2004   for narrow angle glaucoma--laser    INCONTINENCE SURGERY     OOPHORECTOMY Right 1988   dermoid cyst   PARTIAL COLECTOMY  10/2002   Segmental resection of sigmoid.  Endometriosis was on colon as well--approx 6 inches.   Family History  Problem Relation Age of Onset   Arthritis Mother    Heart disease Father    Atrial fibrillation Father    Diabetes Father    AAA (abdominal aortic aneurysm) Father    Cancer Father        pancoast tumor   Breast cancer Sister    Cancer Sister    Colon cancer Maternal Uncle    Colon cancer Maternal Uncle     Diabetes Daughter    Allergic rhinitis Neg Hx    Asthma Neg Hx    Atopy Neg Hx    Angioedema Neg Hx    Eczema Neg Hx    Immunodeficiency Neg Hx    Urticaria Neg Hx    Social History   Socioeconomic History   Marital status: Married    Spouse name: Richard   Number of children: 2   Years of education: 12   Highest education level: Not on file  Occupational History   Occupation: teller    Comment: first [employer]  Tobacco Use   Smoking status: Never   Smokeless tobacco: Never  Vaping Use   Vaping status: Never Used  Substance and Sexual Activity   Alcohol  use: No   Drug use: No   Sexual activity: Yes    Birth control/protection: Post-menopausal, Surgical  Other Topics Concern   Not on file  Social History Narrative   Married, 2 daughters.   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   Social Drivers of Corporate investment banker Strain: Low Risk  (10/27/2023)   Overall Financial Resource Strain (CARDIA)    Difficulty of Paying Living Expenses: Not hard at all  Food Insecurity: No Food Insecurity (10/27/2023)   Hunger Vital Sign    Worried About Running Out of Food in the Last Year: Never true    Ran Out of Food in the Last Year: Never true  Transportation Needs: No Transportation Needs (10/27/2023)   PRAPARE - Administrator, Civil Service (Medical): No    Lack of Transportation (Non-Medical): No  Physical Activity: Insufficiently Active (10/27/2023)   Exercise Vital Sign    Days of Exercise per Week: 3 days    Minutes of Exercise per Session: 30 min  Stress: No Stress Concern Present (10/27/2023)   [employer] of Occupational Health - Occupational Stress Questionnaire    Feeling of Stress : Not at all  Social Connections: Socially Integrated (10/27/2023)   Social Connection and Isolation Panel [NHANES]    Frequency of Communication with Friends and Family: More than three times a week    Frequency of Social  Gatherings with Friends and Family: Three times a week    Attends Religious Services: More than 4 times per year    Active Member of Clubs or Organizations: Yes    Attends Engineer, structural: More than 4 times per year    Marital Status: Married    Tobacco Counseling Counseling given: Not Answered    Clinical Intake:  Pre-visit preparation completed: Yes  Pain : No/denies pain     Diabetes: No  No results found for: "HGBA1C"   How often do you need to have someone help you when you read instructions, pamphlets, or other written materials from your doctor or pharmacy?: 1 - Never  Interpreter Needed?: No  Information entered by :: Seabron Cypress LPN   Activities of Daily Living     10/27/2023   11:31 AM  In your present state of health, do you have any difficulty performing the following activities:  Hearing? 0  Vision? 0  Difficulty concentrating or making decisions? 0  Walking or climbing stairs? 0  Dressing or bathing? 0  Doing errands, shopping? 0  Preparing Food and eating ? N  Using the Toilet? N  In the past six months, have you accidently leaked urine? N  Do you have problems with loss of bowel control? N  Managing your Medications? N  Managing your Finances? N  Housekeeping or managing your Housekeeping? N    Patient Care Team: Cook, Jayce G, DO as PCP - General (Family Medicine) Idolina Maker Mirna Amis, MD as Consulting Physician (Allergy and Immunology) Darleen Ee, MD as Consulting Physician (Family Medicine)  Indicate any recent Medical Services you may have received from other than [facility] providers in the past year (date may be approximate).     Assessment:    This is a routine wellness examination for Melinda English.  Hearing/Vision screen Hearing Screening - Comments:: Denies hearing difficulties   Vision Screening - Comments:: No vision problems; will schedule routine eye exam soon     Goals Addressed  This  Visit's Progress    Remain active and independent   On track      Depression Screen     10/27/2023   11:30 AM 06/17/2022    9:06 AM 03/25/2022    2:08 PM 01/10/2017    8:18 AM 05/25/2015    2:18 PM  PHQ 2/9 Scores  PHQ - 2 Score 0 0 0 0 0  Exception Documentation     Other- indicate reason in comment box    Fall Risk     10/27/2023   11:31 AM 06/17/2022    9:06 AM 03/25/2022    2:08 PM 01/10/2017    8:18 AM 05/25/2015    2:18 PM  Fall Risk   Falls in the past year? 0 0 0 No No  Number falls in past yr: 0 0 0    Injury with Fall? 0 0 0    Risk for fall due to : No Fall Risks No Fall Risks No Fall Risks  Other (Comment)  Follow up Falls prevention discussed;Education provided;Falls evaluation completed Falls evaluation completed Falls evaluation completed      MEDICARE RISK AT HOME:  Medicare Risk at Home Any stairs in or around the home?: No If so, are there any without handrails?: No Home free of loose throw rugs in walkways, pet beds, electrical cords, etc?: Yes Adequate lighting in your home to reduce risk of falls?: Yes Life alert?: No Use of a cane, walker or w/c?: No Grab bars in the bathroom?: Yes Shower chair or bench in shower?: No Elevated toilet seat or a handicapped toilet?: Yes  TIMED UP AND GO:  Was the test performed?  No  Cognitive Function: 6CIT completed        10/27/2023   11:32 AM  6CIT Screen  What Year? 0 points  What month? 0 points  What time? 0 points  Count back from 20 0 points  Months in reverse 0 points  Repeat phrase 0 points  Total Score 0 points    Immunizations Immunization History  Administered Date(s) Administered   PNEUMOCOCCAL CONJUGATE-20 06/17/2022   Tdap 06/13/2010    Screening Tests Health Maintenance  Topic Date Due   DTaP/Tdap/Td (2 - Td or Tdap) 06/13/2020   INFLUENZA VACCINE  01/12/2024   MAMMOGRAM  10/20/2024   Medicare Annual Wellness (AWV)  10/26/2024   Colonoscopy  05/11/2027   Pneumonia Vaccine 71+  Years old  Completed   DEXA SCAN  Completed   Hepatitis C Screening  Completed   Zoster Vaccines- Shingrix  Completed   HPV VACCINES  Aged Out   Meningococcal B Vaccine  Aged Out   COVID-19 Vaccine  Discontinued    Health Maintenance  Health Maintenance Due  Topic Date Due   DTaP/Tdap/Td (2 - Td or Tdap) 06/13/2020   Health Maintenance Items Addressed: Mammogram ordered, DEXA ordered  Additional Screening:  Vision Screening: Recommended annual ophthalmology exams for early detection of glaucoma and other disorders of the eye.  Dental Screening: Recommended annual dental exams for proper oral hygiene  Community Resource Referral / Chronic Care Management: CRR required this visit?  No   CCM required this visit?  No   Plan:    I have personally reviewed and noted the following in the patient's chart:   Medical and social history Use of alcohol, tobacco or illicit drugs  Current medications and supplements including opioid prescriptions. Patient is not currently taking opioid prescriptions. Functional ability and status Nutritional status Physical activity  Advanced directives List of other physicians Hospitalizations, surgeries, and ER visits in previous 12 months Vitals Screenings to include cognitive, depression, and falls Referrals and appointments  In addition, I have reviewed and discussed with patient certain preventive protocols, quality metrics, and best practice recommendations. A written personalized care plan for preventive services as well as general preventive health recommendations were provided to patient.   Seabron Cypress [place], [place]   9/81/1914   After Visit Summary: (MyChart) Due to this being a telephonic visit, the after visit summary with patients personalized plan was offered to patient via MyChart   Notes: Nothing significant to report at this time.

## 2023-10-29 MED ORDER — NURTEC 75 MG PO TBDP: 75.0000 mg | ORAL_TABLET | ORAL | 6 refills | Status: AC

## 2023-10-30 ENCOUNTER — Telehealth: Payer: Self-pay | Admitting: Pharmacy Technician

## 2023-10-30 ENCOUNTER — Other Ambulatory Visit (HOSPITAL_COMMUNITY): Payer: Self-pay

## 2023-10-30 NOTE — Telephone Encounter (Signed)
 Pharmacy Patient Advocate Encounter  Received notification from [facility] that Prior Authorization for Nurtec 75MG  dispersible tablets has been APPROVED from 10/30/2023 to 06/12/2024. Ran test claim, Copay is $518.54. This test claim was processed through [facility]- copay amounts may vary at other pharmacies due to pharmacy/plan contracts, or as the patient moves through the different stages of their insurance plan.   PA #/Case ID/Reference #: [id number]

## 2023-10-30 NOTE — Telephone Encounter (Signed)
 Pharmacy Patient Advocate Encounter   Received notification from CoverMyMeds that prior authorization for Nurtec ODT 75mg  tablets is required/requested.   Insurance verification completed.   The patient is insured through [facility] .   Per test claim: PA required; PA submitted to above mentioned insurance via CoverMyMeds Key/confirmation #/EOC [id number] Status is pending

## 2023-11-10 DIAGNOSIS — H5203 Hypermetropia, bilateral: Secondary | ICD-10-CM | POA: Diagnosis not present

## 2023-11-10 DIAGNOSIS — H524 Presbyopia: Secondary | ICD-10-CM | POA: Diagnosis not present

## 2023-11-21 ENCOUNTER — Encounter: Admitting: Family Medicine

## 2023-11-23 ENCOUNTER — Ambulatory Visit: Admitting: Family Medicine

## 2023-11-23 VITALS — BP 134/89 | Ht 65.0 in | Wt 173.4 lb

## 2023-11-23 DIAGNOSIS — Z0000 Encounter for general adult medical examination without abnormal findings: Secondary | ICD-10-CM

## 2023-11-23 NOTE — Patient Instructions (Signed)
Follow up annually. ? ?Take care ? ?Dr. Kivon Aprea  ?

## 2023-11-24 DIAGNOSIS — Z0100 Encounter for examination of eyes and vision without abnormal findings: Secondary | ICD-10-CM | POA: Diagnosis not present

## 2023-11-27 DIAGNOSIS — Z0000 Encounter for general adult medical examination without abnormal findings: Secondary | ICD-10-CM | POA: Insufficient documentation

## 2023-11-27 NOTE — Assessment & Plan Note (Signed)
 Doing well. Health care maintenance updated. Can get Tdap at the pharmacy. Follow up annually.

## 2023-11-27 NOTE — Progress Notes (Signed)
 Subjective:  Patient ID: Melinda English, female    DOB: [birth year]  Age: 68 y.o. MRN: [id number]  CC:   Chief Complaint  Patient presents with   Annual Exam    HPI:  68 year old female presents for an annual exam.   Patient reports that she is doing well.  Her preventative health care is up-to-date excluding tetanus.  She can get this at her pharmacy if she desires.  Medicare will not pay for this in our clinic.  No chest pain or SOB.  Feeling well. No complaints.   Patient Active Problem List   Diagnosis Date Noted   Annual physical exam 11/27/2023   Chronic left shoulder pain 03/27/2022   Mixed hyperlipidemia 03/25/2022   Migraine headache 01/10/2017   Chronic GERD 01/10/2017   Venous insufficiency 01/10/2017    Social Hx   Social History   Socioeconomic History   Marital status: Married    Spouse name: Richard   Number of children: 2   Years of education: 12   Highest education level: Not on file  Occupational History   Occupation: Engineer, materials    Comment: first [employer]  Tobacco Use   Smoking status: Never   Smokeless tobacco: Never  Vaping Use   Vaping status: Never Used  Substance and Sexual Activity   Alcohol use: No   Drug use: No   Sexual activity: Yes    Birth control/protection: Post-menopausal, Surgical  Other Topics Concern   Not on file  Social History Narrative   Married, 2 daughters.   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   Social Drivers of Corporate investment banker Strain: Low Risk  (10/27/2023)   Overall Financial Resource Strain (CARDIA)    Difficulty of Paying Living Expenses: Not hard at all  Food Insecurity: No Food Insecurity (10/27/2023)   Hunger Vital Sign    Worried About Running Out of Food in the Last Year: Never true    Ran Out of Food in the Last Year: Never true  Transportation Needs: No Transportation Needs (10/27/2023)   PRAPARE - Administrator, Civil Service  (Medical): No    Lack of Transportation (Non-Medical): No  Physical Activity: Insufficiently Active (10/27/2023)   Exercise Vital Sign    Days of Exercise per Week: 3 days    Minutes of Exercise per Session: 30 min  Stress: No Stress Concern Present (10/27/2023)   [employer] of Occupational Health - Occupational Stress Questionnaire    Feeling of Stress : Not at all  Social Connections: Socially Integrated (10/27/2023)   Social Connection and Isolation Panel    Frequency of Communication with Friends and Family: More than three times a week    Frequency of Social Gatherings with Friends and Family: Three times a week    Attends Religious Services: More than 4 times per year    Active Member of Clubs or Organizations: Yes    Attends Banker Meetings: More than 4 times per year    Marital Status: Married    Review of Systems Per HPI  Objective:  BP 134/89   Ht 5' 5 (1.651 m)   Wt 173 lb 6.4 oz (78.7 kg)   BMI 28.86 kg/m      11/23/2023    1:38 PM 10/27/2023   11:27 AM 06/17/2022    9:04 AM  BP/Weight  Systolic BP 134 -- 124  Diastolic BP 89 --  70  Wt. (Lbs) 173.4 172 172.2  BMI 28.86 kg/m2 28.62 kg/m2 28.66 kg/m2    Physical Exam Vitals and nursing note reviewed.  Constitutional:      General: She is not in acute distress.    Appearance: Normal appearance.  HENT:     Head: Normocephalic and atraumatic.   Eyes:     General:        Right eye: No discharge.        Left eye: No discharge.     Conjunctiva/sclera: Conjunctivae normal.    Cardiovascular:     Rate and Rhythm: Normal rate and regular rhythm.  Pulmonary:     Effort: Pulmonary effort is normal.     Breath sounds: Normal breath sounds. No wheezing, rhonchi or rales.   Neurological:     Mental Status: She is alert.   Psychiatric:        Mood and Affect: Mood normal.        Behavior: Behavior normal.     Lab Results  Component Value Date   WBC 4.5 08/11/2023   HGB 14.7  08/11/2023   HCT 44.1 08/11/2023   PLT 227 08/11/2023   GLUCOSE 90 10/06/2023   CHOL 213 (H) 09/15/2023   TRIG 152 (H) 09/15/2023   HDL 41 09/15/2023   LDLDIRECT 115.0 12/28/2015   LDLCALC 144 (H) 09/15/2023   ALT 21 10/06/2023   AST 22 10/06/2023   NA 141 10/06/2023   K 5.1 10/06/2023   CL 103 10/06/2023   CREATININE 0.99 10/06/2023   BUN 18 10/06/2023   CO2 22 10/06/2023   TSH 2.41 01/10/2017     Assessment & Plan:  Annual physical exam Assessment & Plan: Doing well. Health care maintenance updated. Can get Tdap at the pharmacy. Follow up annually.    Follow-up:  Annually  Dung Prien DO [facility]

## 2024-01-02 ENCOUNTER — Ambulatory Visit: Payer: Self-pay

## 2024-01-02 NOTE — Telephone Encounter (Signed)
 FYI Only or Action Required?: FYI only for provider.  Patient was last seen in primary care on 11/23/2023 by Cook, Jayce G, DO.  Called Nurse Triage reporting Eye Pain.  Symptoms began yesterday.  Interventions attempted: Rest, hydration, or home remedies.  Symptoms are: unchanged.  Triage Disposition: See HCP Within 4 Hours (Or PCP Triage)-recommended to urgent care  Patient/caregiver understands and will follow disposition?: Yes  Copied from CRM [id number]. Topic: Clinical - Red Word Triage >> Jan 02, 2024  1:20 PM Turkey B wrote: [facility] that prompted transfer to Nurse Triage: pt says has pink eye and severe pain in both eyes and itchy Reason for Disposition  MODERATE eye pain or discomfort (e.g., interferes with normal activities or awakens from sleep; more than mild)  Answer Assessment - Initial Assessment Questions 1. ONSET: When did the pain start? (e.g., minutes, hours, days)     Yesterday evening 2. TIMING: Does the pain come and go, or has it been constant since it started? (e.g., constant, intermittent, fleeting)     constant 3. SEVERITY: How bad is the pain?  (Scale 1-10; mild, moderate or severe)     5 out of 10 4. LOCATION: Where does it hurt?  (e.g., eyelid, eye, cheekbone)     Both eyes 5. CAUSE: What do you think is causing the pain?     Possible pink eye 6. VISION: Do you have blurred vision or changes in your vision?      no 7. EYE DISCHARGE: Is there any discharge (pus) from the eye(s)?  If Yes, ask: What color is it?      Crusty discharge to eyelid 8. FEVER: Do you have a fever? If Yes, ask: What is it, how was it measured, and when did it start?      no 9. OTHER SYMPTOMS: Do you have any other symptoms? (e.g., headache, nasal discharge, facial rash)     Itching, burning  Protocols used: Eye Pain and Other Symptoms-A-AH

## 2024-01-26 ENCOUNTER — Ambulatory Visit (HOSPITAL_COMMUNITY)
Admission: RE | Admit: 2024-01-26 | Discharge: 2024-01-26 | Disposition: A | Discharge status: Home / Self Care | Source: Ambulatory Visit | Attending: Family Medicine | Admitting: Family Medicine

## 2024-01-26 DIAGNOSIS — Z1231 Encounter for screening mammogram for malignant neoplasm of breast: Secondary | ICD-10-CM | POA: Insufficient documentation

## 2024-03-27 ENCOUNTER — Encounter (INDEPENDENT_AMBULATORY_CARE_PROVIDER_SITE_OTHER): Payer: Self-pay | Admitting: Gastroenterology

## 2024-11-01 ENCOUNTER — Ambulatory Visit
